# Patient Record
Sex: Male | Born: 1965 | Race: White | Hispanic: No | Marital: Single | Smoking: Never smoker
Health system: Southern US, Community
[De-identification: ages and names within clinical notes are randomized; demographics above are authoritative.]

## PROBLEM LIST (undated history)

## (undated) DIAGNOSIS — E119 Type 2 diabetes mellitus without complications: Secondary | ICD-10-CM

## (undated) DIAGNOSIS — K5090 Crohn's disease, unspecified, without complications: Secondary | ICD-10-CM

## (undated) DIAGNOSIS — E785 Hyperlipidemia, unspecified: Secondary | ICD-10-CM

## (undated) DIAGNOSIS — K529 Noninfective gastroenteritis and colitis, unspecified: Secondary | ICD-10-CM

## (undated) DIAGNOSIS — I10 Essential (primary) hypertension: Secondary | ICD-10-CM

## (undated) DIAGNOSIS — K5190 Ulcerative colitis, unspecified, without complications: Secondary | ICD-10-CM

## (undated) DIAGNOSIS — K635 Polyp of colon: Secondary | ICD-10-CM

## (undated) DIAGNOSIS — M150 Primary generalized (osteo)arthritis: Secondary | ICD-10-CM

## (undated) DIAGNOSIS — Z9289 Personal history of other medical treatment: Secondary | ICD-10-CM

## (undated) HISTORY — DX: Type 2 diabetes mellitus without complications: E11.9

## (undated) HISTORY — DX: Crohn's disease, unspecified, without complications: K50.90

## (undated) HISTORY — DX: Personal history of other medical treatment: Z92.89

## (undated) HISTORY — DX: Polyp of colon: K63.5

## (undated) HISTORY — DX: Ulcerative colitis, unspecified, without complications: K51.90

## (undated) HISTORY — DX: Hyperlipidemia, unspecified: E78.5

## (undated) HISTORY — DX: Primary generalized (osteo)arthritis: M15.0

## (undated) HISTORY — DX: Noninfective gastroenteritis and colitis, unspecified: K52.9

## (undated) HISTORY — DX: Essential (primary) hypertension: I10

## (undated) HISTORY — PX: EYE SURGERY: SHX253

## (undated) HISTORY — PX: OTHER SURGICAL HISTORY: SHX169

---

## 1993-04-29 ENCOUNTER — Encounter: Payer: Self-pay | Admitting: Internal Medicine

## 1993-05-02 ENCOUNTER — Encounter: Payer: Self-pay | Admitting: Internal Medicine

## 1993-05-20 ENCOUNTER — Encounter: Payer: Self-pay | Admitting: Internal Medicine

## 1995-09-07 ENCOUNTER — Encounter: Payer: Self-pay | Admitting: Internal Medicine

## 1997-01-21 ENCOUNTER — Encounter: Payer: Self-pay | Admitting: Internal Medicine

## 1998-02-05 ENCOUNTER — Encounter: Payer: Self-pay | Admitting: Internal Medicine

## 1999-02-21 ENCOUNTER — Encounter: Payer: Self-pay | Admitting: Internal Medicine

## 1999-02-21 ENCOUNTER — Other Ambulatory Visit: Admission: RE | Admit: 1999-02-21 | Discharge: 1999-02-21 | Payer: Self-pay | Admitting: Internal Medicine

## 1999-12-26 HISTORY — PX: COLON RESECTION: SHX5231

## 2000-03-28 ENCOUNTER — Other Ambulatory Visit: Admission: RE | Admit: 2000-03-28 | Discharge: 2000-03-28 | Payer: Self-pay | Admitting: Internal Medicine

## 2000-03-28 ENCOUNTER — Encounter: Payer: Self-pay | Admitting: Internal Medicine

## 2000-03-28 ENCOUNTER — Encounter (INDEPENDENT_AMBULATORY_CARE_PROVIDER_SITE_OTHER): Payer: Self-pay | Admitting: Specialist

## 2000-03-31 ENCOUNTER — Encounter: Payer: Self-pay | Admitting: Internal Medicine

## 2000-03-31 ENCOUNTER — Encounter (INDEPENDENT_AMBULATORY_CARE_PROVIDER_SITE_OTHER): Payer: Self-pay | Admitting: *Deleted

## 2000-03-31 ENCOUNTER — Ambulatory Visit (HOSPITAL_COMMUNITY): Admission: RE | Admit: 2000-03-31 | Discharge: 2000-03-31 | Payer: Self-pay | Admitting: Internal Medicine

## 2000-12-25 HISTORY — PX: VENTRAL HERNIA REPAIR: SHX424

## 2001-08-21 ENCOUNTER — Encounter (INDEPENDENT_AMBULATORY_CARE_PROVIDER_SITE_OTHER): Payer: Self-pay | Admitting: *Deleted

## 2001-08-21 ENCOUNTER — Encounter: Payer: Self-pay | Admitting: Internal Medicine

## 2001-08-21 ENCOUNTER — Ambulatory Visit (HOSPITAL_COMMUNITY): Admission: RE | Admit: 2001-08-21 | Discharge: 2001-08-21 | Payer: Self-pay | Admitting: Internal Medicine

## 2001-11-01 ENCOUNTER — Encounter: Admission: RE | Admit: 2001-11-01 | Discharge: 2001-11-01 | Payer: Self-pay | Admitting: Internal Medicine

## 2001-11-01 ENCOUNTER — Encounter (INDEPENDENT_AMBULATORY_CARE_PROVIDER_SITE_OTHER): Payer: Self-pay | Admitting: *Deleted

## 2001-11-01 ENCOUNTER — Encounter: Payer: Self-pay | Admitting: Internal Medicine

## 2001-12-13 ENCOUNTER — Encounter: Admission: RE | Admit: 2001-12-13 | Discharge: 2001-12-13 | Payer: Self-pay | Admitting: Infectious Diseases

## 2002-02-24 ENCOUNTER — Encounter: Admission: RE | Admit: 2002-02-24 | Discharge: 2002-02-24 | Payer: Self-pay | Admitting: Infectious Diseases

## 2003-09-17 ENCOUNTER — Encounter: Payer: Self-pay | Admitting: Internal Medicine

## 2005-12-14 ENCOUNTER — Ambulatory Visit: Payer: Self-pay | Admitting: Internal Medicine

## 2006-01-04 ENCOUNTER — Ambulatory Visit: Payer: Self-pay | Admitting: Internal Medicine

## 2006-02-07 ENCOUNTER — Ambulatory Visit: Payer: Self-pay | Admitting: Internal Medicine

## 2006-05-25 ENCOUNTER — Ambulatory Visit: Payer: Self-pay | Admitting: Internal Medicine

## 2006-12-06 ENCOUNTER — Ambulatory Visit: Payer: Self-pay | Admitting: Internal Medicine

## 2008-04-17 ENCOUNTER — Ambulatory Visit: Payer: Self-pay | Admitting: Internal Medicine

## 2008-05-01 ENCOUNTER — Encounter: Payer: Self-pay | Admitting: Internal Medicine

## 2008-05-01 ENCOUNTER — Ambulatory Visit: Payer: Self-pay | Admitting: Internal Medicine

## 2008-05-01 LAB — CONVERTED CEMR LAB
ALT: 25 units/L (ref 0–53)
AST: 34 units/L (ref 0–37)
Albumin: 3.8 g/dL (ref 3.5–5.2)
Alkaline Phosphatase: 90 units/L (ref 39–117)
BUN: 15 mg/dL (ref 6–23)
Basophils Absolute: 0 10*3/uL (ref 0.0–0.1)
Basophils Relative: 0.4 % (ref 0.0–1.0)
Bilirubin, Direct: 0.2 mg/dL (ref 0.0–0.3)
CO2: 28 meq/L (ref 19–32)
CRP, High Sensitivity: 6 — ABNORMAL HIGH (ref 0.00–5.00)
Calcium: 9.1 mg/dL (ref 8.4–10.5)
Chloride: 107 meq/L (ref 96–112)
Creatinine, Ser: 1.3 mg/dL (ref 0.4–1.5)
Eosinophils Absolute: 0.1 10*3/uL (ref 0.0–0.7)
Eosinophils Relative: 1.9 % (ref 0.0–5.0)
GFR calc Af Amer: 78 mL/min
GFR calc non Af Amer: 64 mL/min
Glucose, Bld: 131 mg/dL — ABNORMAL HIGH (ref 70–99)
HCT: 45.9 % (ref 39.0–52.0)
Hemoglobin: 14.9 g/dL (ref 13.0–17.0)
Lymphocytes Relative: 29.1 % (ref 12.0–46.0)
MCHC: 32.5 g/dL (ref 30.0–36.0)
MCV: 82.8 fL (ref 78.0–100.0)
Monocytes Absolute: 0.7 10*3/uL (ref 0.1–1.0)
Monocytes Relative: 12.4 % — ABNORMAL HIGH (ref 3.0–12.0)
Neutro Abs: 3.4 10*3/uL (ref 1.4–7.7)
Neutrophils Relative %: 56.2 % (ref 43.0–77.0)
Platelets: 203 10*3/uL (ref 150–400)
Potassium: 3.8 meq/L (ref 3.5–5.1)
RBC: 5.54 M/uL (ref 4.22–5.81)
RDW: 13.3 % (ref 11.5–14.6)
Sodium: 140 meq/L (ref 135–145)
Total Bilirubin: 2.1 mg/dL — ABNORMAL HIGH (ref 0.3–1.2)
Total Protein: 8.1 g/dL (ref 6.0–8.3)
WBC: 5.9 10*3/uL (ref 4.5–10.5)

## 2008-05-12 DIAGNOSIS — K5289 Other specified noninfective gastroenteritis and colitis: Secondary | ICD-10-CM

## 2008-05-12 DIAGNOSIS — K5190 Ulcerative colitis, unspecified, without complications: Secondary | ICD-10-CM | POA: Insufficient documentation

## 2008-05-12 DIAGNOSIS — E739 Lactose intolerance, unspecified: Secondary | ICD-10-CM

## 2008-05-14 ENCOUNTER — Ambulatory Visit: Payer: Self-pay | Admitting: Internal Medicine

## 2008-05-14 DIAGNOSIS — K5150 Left sided colitis without complications: Secondary | ICD-10-CM | POA: Insufficient documentation

## 2008-06-19 ENCOUNTER — Ambulatory Visit: Payer: Self-pay | Admitting: Internal Medicine

## 2008-06-19 DIAGNOSIS — R1013 Epigastric pain: Secondary | ICD-10-CM | POA: Insufficient documentation

## 2008-06-19 DIAGNOSIS — K219 Gastro-esophageal reflux disease without esophagitis: Secondary | ICD-10-CM

## 2008-07-23 ENCOUNTER — Ambulatory Visit: Payer: Self-pay | Admitting: Internal Medicine

## 2008-08-03 ENCOUNTER — Telehealth: Payer: Self-pay | Admitting: Internal Medicine

## 2008-08-18 ENCOUNTER — Ambulatory Visit: Payer: Self-pay | Admitting: Internal Medicine

## 2008-09-04 ENCOUNTER — Encounter (HOSPITAL_COMMUNITY): Admission: RE | Admit: 2008-09-04 | Discharge: 2008-12-03 | Payer: Self-pay | Admitting: Internal Medicine

## 2008-09-04 ENCOUNTER — Encounter: Payer: Self-pay | Admitting: Internal Medicine

## 2008-09-04 ENCOUNTER — Telehealth: Payer: Self-pay | Admitting: Internal Medicine

## 2008-09-10 ENCOUNTER — Ambulatory Visit: Payer: Self-pay | Admitting: Internal Medicine

## 2008-09-18 ENCOUNTER — Encounter: Payer: Self-pay | Admitting: Internal Medicine

## 2008-09-21 ENCOUNTER — Encounter: Payer: Self-pay | Admitting: Internal Medicine

## 2008-10-16 ENCOUNTER — Encounter: Payer: Self-pay | Admitting: Internal Medicine

## 2008-11-12 ENCOUNTER — Ambulatory Visit: Payer: Self-pay | Admitting: Internal Medicine

## 2008-12-08 ENCOUNTER — Encounter: Payer: Self-pay | Admitting: Internal Medicine

## 2008-12-10 ENCOUNTER — Encounter (HOSPITAL_COMMUNITY): Admission: RE | Admit: 2008-12-10 | Discharge: 2009-03-10 | Payer: Self-pay | Admitting: Internal Medicine

## 2008-12-11 ENCOUNTER — Encounter: Payer: Self-pay | Admitting: Internal Medicine

## 2008-12-22 ENCOUNTER — Encounter: Payer: Self-pay | Admitting: Internal Medicine

## 2008-12-28 ENCOUNTER — Telehealth: Payer: Self-pay | Admitting: Internal Medicine

## 2009-01-15 ENCOUNTER — Ambulatory Visit: Payer: Self-pay | Admitting: Internal Medicine

## 2009-02-04 ENCOUNTER — Encounter: Payer: Self-pay | Admitting: Internal Medicine

## 2009-02-05 ENCOUNTER — Encounter: Payer: Self-pay | Admitting: Internal Medicine

## 2009-03-02 ENCOUNTER — Encounter (INDEPENDENT_AMBULATORY_CARE_PROVIDER_SITE_OTHER): Payer: Self-pay | Admitting: *Deleted

## 2009-04-01 ENCOUNTER — Encounter (HOSPITAL_COMMUNITY): Admission: RE | Admit: 2009-04-01 | Discharge: 2009-06-30 | Payer: Self-pay | Admitting: Internal Medicine

## 2009-04-02 ENCOUNTER — Encounter: Payer: Self-pay | Admitting: Internal Medicine

## 2009-04-08 ENCOUNTER — Ambulatory Visit: Payer: Self-pay | Admitting: Internal Medicine

## 2009-04-29 ENCOUNTER — Encounter: Payer: Self-pay | Admitting: Internal Medicine

## 2009-05-12 ENCOUNTER — Telehealth: Payer: Self-pay | Admitting: Internal Medicine

## 2009-05-14 ENCOUNTER — Encounter: Payer: Self-pay | Admitting: Internal Medicine

## 2009-05-31 ENCOUNTER — Telehealth: Payer: Self-pay | Admitting: Internal Medicine

## 2009-06-18 ENCOUNTER — Telehealth: Payer: Self-pay | Admitting: Internal Medicine

## 2009-06-25 ENCOUNTER — Encounter: Payer: Self-pay | Admitting: Internal Medicine

## 2009-07-02 ENCOUNTER — Telehealth (INDEPENDENT_AMBULATORY_CARE_PROVIDER_SITE_OTHER): Payer: Self-pay | Admitting: *Deleted

## 2009-07-08 ENCOUNTER — Telehealth (INDEPENDENT_AMBULATORY_CARE_PROVIDER_SITE_OTHER): Payer: Self-pay | Admitting: *Deleted

## 2009-07-09 ENCOUNTER — Telehealth: Payer: Self-pay | Admitting: Internal Medicine

## 2009-07-28 ENCOUNTER — Encounter: Payer: Self-pay | Admitting: Internal Medicine

## 2009-07-30 ENCOUNTER — Ambulatory Visit: Payer: Self-pay | Admitting: Internal Medicine

## 2009-08-02 ENCOUNTER — Ambulatory Visit: Payer: Self-pay | Admitting: Internal Medicine

## 2009-08-05 ENCOUNTER — Encounter (HOSPITAL_COMMUNITY): Admission: RE | Admit: 2009-08-05 | Discharge: 2009-11-03 | Payer: Self-pay | Admitting: Internal Medicine

## 2009-08-05 ENCOUNTER — Encounter: Payer: Self-pay | Admitting: Internal Medicine

## 2009-08-06 ENCOUNTER — Encounter: Payer: Self-pay | Admitting: Internal Medicine

## 2009-08-09 ENCOUNTER — Telehealth: Payer: Self-pay | Admitting: Internal Medicine

## 2009-09-08 ENCOUNTER — Encounter: Payer: Self-pay | Admitting: Internal Medicine

## 2009-09-17 ENCOUNTER — Encounter: Payer: Self-pay | Admitting: Internal Medicine

## 2009-10-05 ENCOUNTER — Telehealth: Payer: Self-pay | Admitting: Internal Medicine

## 2009-10-08 ENCOUNTER — Encounter: Payer: Self-pay | Admitting: Internal Medicine

## 2009-10-29 ENCOUNTER — Encounter: Payer: Self-pay | Admitting: Internal Medicine

## 2009-12-10 ENCOUNTER — Encounter (HOSPITAL_COMMUNITY): Admission: RE | Admit: 2009-12-10 | Discharge: 2009-12-24 | Payer: Self-pay | Admitting: Internal Medicine

## 2009-12-10 ENCOUNTER — Encounter: Payer: Self-pay | Admitting: Internal Medicine

## 2010-01-20 ENCOUNTER — Encounter (HOSPITAL_COMMUNITY): Admission: RE | Admit: 2010-01-20 | Discharge: 2010-04-20 | Payer: Self-pay | Admitting: Internal Medicine

## 2010-01-21 ENCOUNTER — Encounter: Payer: Self-pay | Admitting: Internal Medicine

## 2010-01-28 ENCOUNTER — Ambulatory Visit: Payer: Self-pay | Admitting: Internal Medicine

## 2010-01-28 LAB — CONVERTED CEMR LAB
ALT: 21 units/L (ref 0–53)
AST: 21 units/L (ref 0–37)
Albumin: 4.1 g/dL (ref 3.5–5.2)
Alkaline Phosphatase: 77 units/L (ref 39–117)
BUN: 11 mg/dL (ref 6–23)
Basophils Absolute: 0 10*3/uL (ref 0.0–0.1)
Basophils Relative: 0.2 % (ref 0.0–3.0)
Bilirubin, Direct: 0.1 mg/dL (ref 0.0–0.3)
CO2: 26 meq/L (ref 19–32)
CRP, High Sensitivity: 3 (ref 0.00–5.00)
Calcium: 9.6 mg/dL (ref 8.4–10.5)
Chloride: 107 meq/L (ref 96–112)
Creatinine, Ser: 0.8 mg/dL (ref 0.4–1.5)
Eosinophils Absolute: 0.1 10*3/uL (ref 0.0–0.7)
Eosinophils Relative: 1.7 % (ref 0.0–5.0)
GFR calc non Af Amer: 111.49 mL/min (ref >60)
Glucose, Bld: 102 mg/dL — ABNORMAL HIGH (ref 70–99)
HCT: 43.5 % (ref 39.0–52.0)
Hemoglobin: 14.7 g/dL (ref 13.0–17.0)
Lymphocytes Relative: 29.3 % (ref 12.0–46.0)
Lymphs Abs: 1.8 10*3/uL (ref 0.7–4.0)
MCHC: 33.8 g/dL (ref 30.0–36.0)
MCV: 89.9 fL (ref 78.0–100.0)
Monocytes Absolute: 1 10*3/uL (ref 0.1–1.0)
Monocytes Relative: 15.5 % — ABNORMAL HIGH (ref 3.0–12.0)
Neutro Abs: 3.4 10*3/uL (ref 1.4–7.7)
Neutrophils Relative %: 53.3 % (ref 43.0–77.0)
Platelets: 228 10*3/uL (ref 150.0–400.0)
Potassium: 4 meq/L (ref 3.5–5.1)
RBC: 4.84 M/uL (ref 4.22–5.81)
RDW: 11.5 % (ref 11.5–14.6)
Sodium: 140 meq/L (ref 135–145)
TSH: 2.19 microintl units/mL (ref 0.35–5.50)
Total Bilirubin: 0.8 mg/dL (ref 0.3–1.2)
Total Protein: 7.8 g/dL (ref 6.0–8.3)
WBC: 6.3 10*3/uL (ref 4.5–10.5)

## 2010-01-31 ENCOUNTER — Telehealth: Payer: Self-pay | Admitting: Internal Medicine

## 2010-03-03 ENCOUNTER — Ambulatory Visit: Payer: Self-pay | Admitting: Internal Medicine

## 2010-03-04 ENCOUNTER — Telehealth: Payer: Self-pay | Admitting: Internal Medicine

## 2010-03-10 ENCOUNTER — Encounter: Payer: Self-pay | Admitting: Internal Medicine

## 2010-03-16 ENCOUNTER — Encounter: Payer: Self-pay | Admitting: Internal Medicine

## 2010-03-18 ENCOUNTER — Encounter: Payer: Self-pay | Admitting: Internal Medicine

## 2010-04-18 ENCOUNTER — Telehealth (INDEPENDENT_AMBULATORY_CARE_PROVIDER_SITE_OTHER): Payer: Self-pay | Admitting: *Deleted

## 2010-04-26 ENCOUNTER — Telehealth: Payer: Self-pay | Admitting: Internal Medicine

## 2010-04-28 ENCOUNTER — Encounter (HOSPITAL_COMMUNITY)
Admission: RE | Admit: 2010-04-28 | Discharge: 2010-07-27 | Payer: Self-pay | Source: Home / Self Care | Admitting: Internal Medicine

## 2010-04-29 ENCOUNTER — Encounter: Payer: Self-pay | Admitting: Internal Medicine

## 2010-06-10 ENCOUNTER — Encounter: Payer: Self-pay | Admitting: Internal Medicine

## 2010-06-22 ENCOUNTER — Ambulatory Visit: Payer: Self-pay | Admitting: Internal Medicine

## 2010-06-29 ENCOUNTER — Encounter: Payer: Self-pay | Admitting: Internal Medicine

## 2010-07-11 ENCOUNTER — Telehealth: Payer: Self-pay | Admitting: Internal Medicine

## 2010-07-27 ENCOUNTER — Encounter: Payer: Self-pay | Admitting: Internal Medicine

## 2010-07-28 ENCOUNTER — Encounter (HOSPITAL_COMMUNITY)
Admission: RE | Admit: 2010-07-28 | Discharge: 2010-10-26 | Payer: Self-pay | Source: Home / Self Care | Admitting: Internal Medicine

## 2010-08-02 ENCOUNTER — Ambulatory Visit: Payer: Self-pay | Admitting: Internal Medicine

## 2010-08-08 ENCOUNTER — Telehealth: Payer: Self-pay | Admitting: Internal Medicine

## 2010-08-09 ENCOUNTER — Encounter: Payer: Self-pay | Admitting: Internal Medicine

## 2010-09-01 ENCOUNTER — Encounter: Payer: Self-pay | Admitting: Internal Medicine

## 2010-09-09 ENCOUNTER — Encounter: Payer: Self-pay | Admitting: Internal Medicine

## 2010-09-26 ENCOUNTER — Encounter: Payer: Self-pay | Admitting: Internal Medicine

## 2010-10-03 ENCOUNTER — Encounter: Payer: Self-pay | Admitting: Internal Medicine

## 2010-10-17 ENCOUNTER — Encounter: Payer: Self-pay | Admitting: Internal Medicine

## 2010-10-21 ENCOUNTER — Encounter: Payer: Self-pay | Admitting: Internal Medicine

## 2010-10-24 ENCOUNTER — Telehealth: Payer: Self-pay | Admitting: Internal Medicine

## 2010-10-25 ENCOUNTER — Encounter: Payer: Self-pay | Admitting: Internal Medicine

## 2010-11-09 ENCOUNTER — Encounter: Payer: Self-pay | Admitting: Internal Medicine

## 2010-11-29 ENCOUNTER — Encounter: Payer: Self-pay | Admitting: Internal Medicine

## 2010-12-01 ENCOUNTER — Encounter (HOSPITAL_COMMUNITY)
Admission: RE | Admit: 2010-12-01 | Discharge: 2011-01-24 | Payer: Self-pay | Source: Home / Self Care | Attending: Internal Medicine | Admitting: Internal Medicine

## 2010-12-02 ENCOUNTER — Encounter: Payer: Self-pay | Admitting: Internal Medicine

## 2010-12-22 ENCOUNTER — Ambulatory Visit: Payer: Self-pay | Admitting: Internal Medicine

## 2010-12-23 LAB — CONVERTED CEMR LAB
ALT: 20 units/L (ref 0–53)
AST: 28 units/L (ref 0–37)
Albumin: 3.7 g/dL (ref 3.5–5.2)
Alkaline Phosphatase: 63 units/L (ref 39–117)
Basophils Absolute: 0 10*3/uL (ref 0.0–0.1)
CO2: 25 meq/L (ref 19–32)
Glucose, Bld: 157 mg/dL — ABNORMAL HIGH (ref 70–99)
Lymphocytes Relative: 32.5 % (ref 12.0–46.0)
Monocytes Relative: 13.5 % — ABNORMAL HIGH (ref 3.0–12.0)
Platelets: 186 10*3/uL (ref 150.0–400.0)
Potassium: 3.7 meq/L (ref 3.5–5.1)
RDW: 12.7 % (ref 11.5–14.6)
Sodium: 136 meq/L (ref 135–145)
TSH: 1.46 microintl units/mL (ref 0.35–5.50)
Total Protein: 7.5 g/dL (ref 6.0–8.3)

## 2010-12-27 ENCOUNTER — Encounter: Payer: Self-pay | Admitting: Internal Medicine

## 2010-12-28 ENCOUNTER — Telehealth: Payer: Self-pay | Admitting: Internal Medicine

## 2010-12-29 ENCOUNTER — Encounter: Payer: Self-pay | Admitting: Internal Medicine

## 2011-01-13 ENCOUNTER — Telehealth: Payer: Self-pay | Admitting: Internal Medicine

## 2011-01-23 ENCOUNTER — Telehealth (INDEPENDENT_AMBULATORY_CARE_PROVIDER_SITE_OTHER): Payer: Self-pay

## 2011-01-24 NOTE — Medication Information (Signed)
Summary: Patient Assistance/[employer]  Patient Assistance/[employer]   Imported By: Bubba Hales 09/29/2010 08:39:26  _____________________________________________________________________  External Attachment:    Type:   Image     Comment:   External Document

## 2011-01-24 NOTE — Op Note (Signed)
Summary: Remicade/[facility]  Remicade/[facility]   Imported By: Phillis Knack 05/11/2010 11:46:18  _____________________________________________________________________  External Attachment:    Type:   Image     Comment:   External Document

## 2011-01-24 NOTE — Medication Information (Signed)
Summary: Patient Assistance Program / [facility]  Patient Assistance Program / [facility]   Imported By: Rise Patience 11/11/2010 11:44:07  _____________________________________________________________________  External Attachment:    Type:   Image     Comment:   External Document

## 2011-01-24 NOTE — Op Note (Signed)
Summary: Remicade/[facility]   Imported By: Phillis Knack 01/27/2010 07:54:46  _____________________________________________________________________  External Attachment:    Type:   Image     Comment:   External Document

## 2011-01-24 NOTE — Progress Notes (Signed)
Summary: ? re orders  Phone Note Call from Patient Call back at Home Phone [phone] Call back at [phone]   Caller: mom Matthew [name] Call For: [name] Reason for Call: Talk to Nurse Summary of Call: Mother wants to speak to nurse regarding orders for chemo that she just reviewd on bill she received. Initial call taken by: Ronalee Red,  August 08, 2010 8:34 AM  Follow-up for Phone Call        Mother rec'd. bill for pt's Remicade marked as chemotherapy.Arbie Cookey in billing at [phone] contacted and bill should have been written off at 100% which she did due to his finances.. Pt's mother made aware. Follow-up by: Abel Presto RN,  August 08, 2010 11:07 AM

## 2011-01-24 NOTE — Letter (Signed)
Summary: Patient Notice- Colon Biospy Results  [facility]  [address] [place], [place] [address]   Phone: [phone]  Fax: [phone]        March 10, 2010 MRN: [id number]    [place], [place]  [address]    Dear Matthew [name],  I am pleased to inform you that the biopsies taken during your recent colonoscopy did NOT show any evidence of cancer or precancerous polyps upon pathologic examination. There was some atypia (irregularity) to some of the cells on biopsy, but this is likely the result of inflammation.  Additional information/recommendations:  I want you to:  (1) continue the current medical regimine without change;   (2) schedule a follow up office appointment for 3 months; and   (3) plan on repeat colonoscopy with biopsies in ONE year.   Please call us if you are having persistent problems or have questions about your condition that have not been fully answered at this time.  Sincerely,  Irene Shipper MD   This letter has been electronically signed by your physician.  Appended Document: Patient Notice- Colon Biospy Results letter mailed 3.18.11

## 2011-01-24 NOTE — Progress Notes (Signed)
  Phone Note Other Incoming   Request: Send information Summary of Call: Request received from [place] forwarded to Elizabeth.

## 2011-01-24 NOTE — Medication Information (Signed)
Summary: Patient Assistance/[employer]  Patient Assistance/[employer]   Imported By: Bubba Hales 08/15/2010 07:49:12  _____________________________________________________________________  External Attachment:    Type:   Image     Comment:   External Document

## 2011-01-24 NOTE — Medication Information (Signed)
[facility]: [facility]   Imported By: Bubba Hales 10/28/2010 07:26:45  _____________________________________________________________________  External Attachment:    Type:   Image     Comment:   External Document

## 2011-01-24 NOTE — Miscellaneous (Signed)
Summary: Remicade order  Clinical Lists Changes  Orders: Added new Test order of Remicade Infusion (Remicade) - Signed

## 2011-01-24 NOTE — Assessment & Plan Note (Signed)
Summary: TB FOR REMICADE/YF  Nurse Visit   Immunizations Administered:  PPD Skin Test:    Vaccine Type: PPD    Site: left forearm    Mfr: Sanofi Pasteur    Dose: 0.1 ml    Route: ID    Given by: Abelino Derrick CMA ([place])    Exp. Date: 10/07/2011    Lot #: [id number]  PPD Results    Date of reading: 08/04/2010    Results: < 66m    Interpretation: negative PChristian MateCMA ([place]  August 04, 2010 10:06 AM

## 2011-01-24 NOTE — Procedures (Signed)
Summary: Colonoscopy  Patient: Matthew [name] Note: All result statuses are Final unless otherwise noted.  Tests: (1) Colonoscopy (COL)   COL Colonoscopy           [facility]     [place], [place]  [address]           COLONOSCOPY PROCEDURE REPORT           PATIENT:  Matthew [name], Matthew [name]  MR#:  [id number]     BIRTHDATE:  09-08-1966, 43 yrs. old  GENDER:  male           ENDOSCOPIST:  Docia Chuck. Geri Seminole, MD     Referred by:  Office           PROCEDURE DATE:  03/03/2010     PROCEDURE:  Colonoscopy with biopsy,     Colonoscopy with snare polypectomy     x 3           ASA CLASS:  Class II     INDICATIONS:  evaluation of ulcerative colitis - now on Remicade     w/ some symptoms. Last colonoscopy 04-2008           MEDICATIONS:   Fentanyl 100 mcg IV, Versed 12 mg IV           DESCRIPTION OF PROCEDURE:   After the risks benefits and     alternatives of the procedure were thoroughly explained, informed     consent was obtained.  Digital rectal exam was performed and     revealed no abnormalities.   The LB CF-[id number] endoscope     was introduced through the anus and advanced to the anastamosis,     without limitations.  The quality of the prep was excellent, using     MoviPrep.  The instrument was then slowly withdrawn as the colon     was fully examined.     <<PROCEDUREIMAGES>>           FINDINGS:  There were mucosal changes consistent with mild     left-sided ulcerative colitis. Approximately 45cm of colon     remained. 4 quad bx q 10cm (N=18) taken. The anastomosis was     widely patent and neoileum was normal.  Three irregular polyps     were found in the rectum. Polyps were snared, then cauterized with     monopolar cautery. Retrieval was successful.  Also, somewhat     villous appearring tissue in the most distal rectum biopsied     multiple times to r/o dysplasia.  Unable to retroflex.    The     scope was then withdrawn from the patient  and the procedure     completed.           COMPLICATIONS:  None           ENDOSCOPIC IMPRESSION:     1) Colitis - left UC - s/p bx     2) Three polyps in the rectum -removed     3) Irregular rectal tissue biopsied           RECOMMENDATIONS:     1) Await biopsy results     2) continue current medications           ______________________________     Docia Chuck. Geri Seminole, MD           CC:  Kathleene Hazel MD; The Patient  n.     eSIGNED:   Docia Chuck. Geri Seminole at 03/03/2010 10:13 AM           [place], [id number]  Note: An exclamation mark (!) indicates a result that was not dispersed into the flowsheet. Document Creation Date: 03/03/2010 10:13 AM _______________________________________________________________________  (1) Order result status: Final Collection or observation date-time: 03/03/2010 09:51 Requested date-time:  Receipt date-time:  Reported date-time:  Referring Physician:   Ordering Physician: Lavena Bullion [phone]) Specimen Source:  Source: Tawanna Cooler Order Number: [id number] Lab site:   Appended Document: Colonoscopy     Procedures Next Due Date:    Colonoscopy: 02/2011

## 2011-01-24 NOTE — Progress Notes (Signed)
Summary: Shipment of Pentasa  Phone Note Call from Patient   Caller: Patient Summary of Call: Wanted to know if shipment of Pentasa was going to be shipped to his address or [place].  I advised him that I marked it to be sent to his address when completing the order form.  I advised him to call me if he receives it and I would call him if we receive them here.  He agreed. Initial call taken by: Randye Lobo NCMA,  October 24, 2010 1:10 PM

## 2011-01-24 NOTE — Medication Information (Signed)
Summary: [facility]   Imported By: Bubba Hales 09/06/2010 11:02:48  _____________________________________________________________________  External Attachment:    Type:   Image     Comment:   External Document

## 2011-01-24 NOTE — Op Note (Signed)
Summary: Remicade/[facility]  Remicade/[facility]   Imported By: Phillis Knack 09/13/2010 08:04:03  _____________________________________________________________________  External Attachment:    Type:   Image     Comment:   External Document

## 2011-01-24 NOTE — Medication Information (Signed)
Summary: [facility]  [facility]   Imported By: Phillis Knack 08/11/2010 12:21:11  _____________________________________________________________________  External Attachment:    Type:   Image     Comment:   External Document

## 2011-01-24 NOTE — Assessment & Plan Note (Signed)
Summary: ulcerative colitis / 3 months f.u..   History of Present Illness Visit Type: Follow-up Visit Primary GI MD: Scarlette Shorts MD Primary Provider: Kathleene Hazel MD Requesting Provider: n/a Chief Complaint: f/u ulcerative colitis. Pt states he feels good but still having stomach aches History of Present Illness:   45 year old with indeterminate colitis, although blue ulcerative colitis, status post subtotal colectomy with residual 40 cm the left colon and rectum remaining. He is intolerant immunomodulators. His colitis is being treated with a combination of Pentasa 2 g b.i.d. and Remicade 5 mg per kilogram every 6 weeks. He was last evaluated in March when he underwent surveillance colonoscopy. He was found to have active left-sided colitis, rectal polyps, and irregular rectal tissue. Biopsies revealed mild to moderate chronic active ulcerative colitis. Rectal biopsies revealed glandular atypia likely due to inflammation. Due to these changes, the recommendations were continued medical therapy and followup interval colonoscopy in one year. He presents today for routine followup. In terms of his colitis, he feels that he is doing well. He describes 8-10 bowel movements per day. Mostly small bowel movements, but formed. Rare blood. He is certain that he has done much better on Remicade. He does describe a one-year history of gastric distress that occurs in the morning after taking medications. He thinks that it is his oral doxycycline that he takes for his ocular condition. Problem last in the morning about 30 minutes after taking his pills. No problem when he forgets or purposely omits is pill. Otherwise doing well. No fevers, joint aches, skin eruptions or other issues.   GI Review of Systems    Reports abdominal pain.     Location of  Abdominal pain: epigastric upset.    Denies acid reflux, belching, bloating, chest pain, dysphagia with liquids, dysphagia with solids, heartburn, loss of appetite,  nausea, vomiting, vomiting blood, weight loss, and  weight gain.      Reports rectal bleeding.     Denies anal fissure, black tarry stools, change in bowel habit, constipation, diarrhea, diverticulosis, fecal incontinence, heme positive stool, hemorrhoids, irritable bowel syndrome, jaundice, light color stool, liver problems, and  rectal pain.    Current Medications (verified): 1)  Mesalamine 4 Gm Enem (Mesalamine) .... Once Daily 2)  Cosopt 2-0.5 % Soln (Dorzolamide-Timolol) .... 2 Drops in Left Eye Once Daily 3)  Travatan Z 0.004 %  Soln (Travoprost) .... One Drop in Left Eye Once Daily 4)  Pentasa 500 Mg  Cpcr (Mesalamine) .... Four Tablets By Mouth Two Times A Day 5)  Ambien 10 Mg  Tabs (Zolpidem Tartrate) .Marland Kitchen.. 1 By Mouth At Bedtime As Needed 6)  Lotemax 0.5 %  Susp (Loteprednol Etabonate) .... One Drop in Each Once Daily 7)  Tylenol Extra Strength 500 Mg Tabs (Acetaminophen) .... As Needed 8)  Multivitamins  Tabs (Multiple Vitamin) .... Take 1 Tablet By Mouth Once A Day 9)  Imodium Advanced 2-125 Mg Tabs (Loperamide-Simethicone) .Marland Kitchen.. 1 Tablet By Mouth Once Daily As Needed 10)  Glucosamine-Chondroitin   Caps (Glucosamine-Chondroit-Vit C-Mn) .... Once Daily 11)  Tobramycin-Dexamethasone 0.3-0.1 % Susp (Tobramycin-Dexamethasone) .... One Drop in Left Eye Four Times A Day 12)  Remicade 100 Mg Solr (Infliximab) .... 5 Mg/kg Iv Q 6 Weeks  Allergies (verified): 1)  ! * Ivp Dye 2)  ! * Dimetapp  Past History:  Past Medical History: Reviewed history from 05/14/2008 and no changes required. 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 Status post subtotal colectomy with ileal colonic anastomosis August 2001. 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 Family History of Colon Cancer: Maternal Grandfather Family History of Liver Cancer: Maternal Grandfather (mets from colon) Family History of Colitis/Crohn's: Father (colitis) Family History of Colon Polyps: Father, Mother Family History of Diabetes: Father, paternal grandmother Family History of Heart Disease: Father, Paternal Grandmother Family History of Breast Cancer:Cousin Family History of Prostate Cancer:Cousin  Social History: Reviewed history from 11/12/2008 and no changes required. The patient is single and resides at home. He is currently unemployed. Difficulty finding employment in maintaining employment due to his ongoing medical problems. He does not smoke. Has one sister. 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 Alcohol Use - no Daily Caffeine INO-6-VEHM per week Illicit Drug Use - no Patient does not get regular exercise.   Review of Systems       The patient complains of change in vision.  The patient denies allergy/sinus, anemia, anxiety-new, arthritis/joint pain, back pain, blood in urine, breast changes/lumps, confusion, cough, coughing up blood, depression-new, fainting, fatigue, fever, headaches-new, hearing problems, heart murmur, heart rhythm changes, itching, muscle pains/cramps, night sweats, nosebleeds, shortness of breath, skin rash, sleeping problems, sore throat, swelling of feet/legs, swollen lymph glands, thirst - excessive, urination - excessive, urination changes/pain, urine leakage, vision changes, and voice change.    Vital Signs:  Patient profile:   45 year old male Height:      71 inches Weight:      267 pounds BMI:     37.37 BSA:     2.39 Pulse rate:   96 / minute Pulse rhythm:   regular BP sitting:   142 / 80  (left arm) Cuff size:   regular  Vitals Entered By: Hope Pigeon  CMA (June 22, 2010 11:11 AM)  Physical Exam  General:  Well developed, well nourished, no acute distress. Head:  Normocephalic and atraumatic. Eyes:  no icterus Mouth:  no thrush Lungs:  Clear throughout to auscultation. Heart:  Regular rate and rhythm; no murmurs, rubs,  or bruits. Abdomen:  Soft, nontender and nondistended. No masses, hepatosplenomegaly or hernias noted. Normal bowel sounds.. Prior surgical incisions well-healed Pulses:  Normal pulses noted. Extremities:  no edema Neurologic:  alert and oriented Skin:  no jaundice Psych:  Alert and cooperative. Normal mood and affect.   Impression & Recommendations:  Problem # 1:  ULCERATIVE COLITIS-LEFT SIDE (ICD-556.5) clinically stable on combination of mesalamine and Remicade.  Plan: #1. Continue mesalamine #2. Continue Remicade #3. Routine office followup with laboratories in 6 months #4. Contact the office in the interim for questions or problems  Problem # 2:  ABDOMINAL PAIN-EPIGASTRIC (ICD-789.06) daily epigastric discomfort secondary to medication tolerance, likely doxycycline. Could consider talking to his ophthalmologist about other options, and possible.  Patient Instructions: 1)  Please schedule a follow-up appointment in 6 months. 2)  The medication list was reviewed and reconciled.  All changed / newly prescribed medications were explained.  A complete medication list was provided to the patient / caregiver. 3)  Copy: Dr. Kathleene Hazel

## 2011-01-24 NOTE — Miscellaneous (Signed)
Summary: Orders Update-Remicade  Clinical Lists Changes  Orders: Added new Test order of Remicade Infusion (Remicade) - Signed

## 2011-01-24 NOTE — Progress Notes (Signed)
Summary: triage  Phone Note Call from Patient Call back at Home Phone [phone]   Caller: mom [name] Call For: Matthew [name] Reason for Call: Talk to Nurse Summary of Call: Patient had procedure yesterday and vomit last night and had diarrhea all night , states that pt has nausea and is afraid to eat. Initial call taken by: Ronalee Red,  March 04, 2010 8:07 AM  Follow-up for Phone Call        Spoke with pts mother.  After procedure yesterday pt. went home and ate.  He then took his meds and then vomitted x 1.  He had taken doxyclycine which she says "sometimes makes him nauseated".  He then had several episodes of diarrhea.  There was no blood noticed in his stools.  Pt has not vomitted since yesterday but still feels a little nauseated.  Wants to know if he should eat.  Advised pt. to try some crackers or dry toast to see how he does with that.  They are to call back this afternoon if he is no better.  Pt was also scheduled for a Remicade injections today at short stay and his mother called and was told he should not come today.  He was rescheduled for an appt. in 2 weeks.   Follow-up by: Alphonsa Gin RN,  March 04, 2010 8:42 AM     Appended Document: triage agree.

## 2011-01-24 NOTE — Progress Notes (Signed)
Summary: Form  Phone Note Call from Patient Call back at Home Phone [phone]   Caller: Patient Call For: [name] Summary of Call: Patients mother has questions about a form she got from [place] pt assistance and she wants to know what to do with the form and which medication it has to do with.  Initial call taken by: Bernita Buffy CMA Deborra Medina),  July 11, 2010 9:59 AM  Follow-up for Phone Call        Pt. also got Dr's part of pt's Remicade application. I told her Dr.Perry's part has already been completed and faxed on 07/05/10 so she will send [name]'s financial info. Follow-up by: Abel Presto RN,  July 11, 2010 11:45 AM

## 2011-01-24 NOTE — Progress Notes (Signed)
Summary: ? re appt  Phone Note Call from Patient Call back at Home Phone [phone]   Caller: Patient Call For: [name] Reason for Call: Talk to Nurse Summary of Call: Patient wants to know if the day after his procedure(3-10) he can have a remicade infusion (3-11) Initial call taken by: Ronalee Red,  January 31, 2010 2:40 PM  Follow-up for Phone Call        yes Follow-up by: Irene Shipper MD,  January 31, 2010 3:09 PM  Additional Follow-up for Phone Call Additional follow up Details #1::        Pt. notified that it is ok. to have Remicade tx. the day after his colonoscopy per Dr.Perry.. Additional Follow-up by: Abel Presto RN,  January 31, 2010 3:34 PM

## 2011-01-24 NOTE — Medication Information (Signed)
Summary: Pentasa/[facility]  Pentasa/[facility]   Imported By: Phillis Knack 08/08/2010 08:40:30  _____________________________________________________________________  External Attachment:    Type:   Image     Comment:   External Document

## 2011-01-24 NOTE — Op Note (Signed)
Summary: Remicade/[facility]   Imported By: Phillis Knack 03/26/2010 11:32:39  _____________________________________________________________________  External Attachment:    Type:   Image     Comment:   External Document

## 2011-01-24 NOTE — Letter (Signed)
Summary: [facility] Instructions  [facility]  [name], [place] [address]   Phone: [phone]  Fax: [phone]       Matthew [name]    1966/05/11    MRN: [id number]        Procedure Day /Date:THURSDAY, 03/03/10     Arrival Time:7:30 AM     Procedure Time:8:30 AM     Location of Procedure:                    [facility] (4th Floor)                        [place]   Starting 5 days prior to your procedure 02/26/10 do not eat nuts, seeds, popcorn, corn, beans, peas,  salads, or any raw vegetables.  Do not take any fiber supplements (e.g. Metamucil, Citrucel, and Benefiber).  THE DAY BEFORE YOUR PROCEDURE         DATE: 03/02/10  DAY: WEDNESDAY  1.  Drink clear liquids the entire day-NO SOLID FOOD  2.  Do not drink anything colored red or purple.  Avoid juices with pulp.  No orange juice.  3.  Drink at least 64 oz. (8 glasses) of fluid/clear liquids during the day to prevent dehydration and help the prep work efficiently.  CLEAR LIQUIDS INCLUDE: Water Jello Ice Popsicles Tea (sugar ok, no milk/cream) Powdered fruit flavored drinks Coffee (sugar ok, no milk/cream) Gatorade Juice: apple, white grape, white cranberry  Lemonade Clear bullion, consomm, broth Carbonated beverages (any kind) Strained chicken noodle soup Hard Candy                             4.  In the morning, mix first dose of MoviPrep solution:    Empty 1 Pouch A and 1 Pouch B into the disposable container    Add lukewarm drinking water to the top line of the container. Mix to dissolve    Refrigerate (mixed solution should be used within 24 hrs)  5.  Begin drinking the prep at 5:00 p.m. The MoviPrep container is divided by 4 marks.   Every 15 minutes drink the solution down to the next mark (approximately 8 oz) until the full liter is complete.   6.  Follow completed prep with 16 oz of clear liquid of your choice (Nothing red  or purple).  Continue to drink clear liquids until bedtime.  7.  Before going to bed, mix second dose of MoviPrep solution:    Empty 1 Pouch A and 1 Pouch B into the disposable container    Add lukewarm drinking water to the top line of the container. Mix to dissolve    Refrigerate  THE DAY OF YOUR PROCEDURE      DATE: 03/03/10 [id number]  Beginning at 3:30 a.m. (5 hours before procedure):         1. Every 15 minutes, drink the solution down to the next mark (approx 8 oz) until the full liter is complete.  2. Follow completed prep with 16 oz. of clear liquid of your choice.    3. You may drink clear liquids until 6:30 AM (2 HOURS BEFORE PROCEDURE).   MEDICATION INSTRUCTIONS  Unless otherwise instructed, you should take regular prescription medications with a small sip of water   as early as possible the morning of your procedure.  OTHER INSTRUCTIONS  You will need a responsible adult at least 46 years of age to accompany you and drive you home.   This person must remain in the waiting room during your procedure.  Wear loose fitting clothing that is easily removed.  Leave jewelry and other valuables at home.  However, you may wish to bring a book to read or  an iPod/MP3 player to listen to music as you wait for your procedure to start.  Remove all body piercing jewelry and leave at home.  Total time from sign-in until discharge is approximately 2-3 hours.  You should go home directly after your procedure and rest.  You can resume normal activities the  day after your procedure.  The day of your procedure you should not:   Drive   Make legal decisions   Operate machinery   Drink alcohol   Return to work  You will receive specific instructions about eating, activities and medications before you leave.    The above instructions have been reviewed and explained to me by   _______________________    I fully understand and can verbalize these  instructions _____________________________ Date _________

## 2011-01-24 NOTE — Progress Notes (Signed)
Summary: refill  Phone Note Call from Patient Call back at Home Phone [phone]   Caller: mother Call For: Dr. [name] Reason for Call: Refill Medication Summary of Call: [facility] refill needed... mother says pt is on a "program" that sends pt free meds Initial call taken by: Lucien Mons,  Apr 26, 2010 9:48 AM  Follow-up for Phone Call        [facility] and pt. has 2 RFs. remaining.  They will ship to patient and will send our office and patient a renewal form when it is time.  Rx. is good to October 2011.  AttnLinwood Dibbles.  Randye Lobo NCMA  Apr 27, 2010 9:36 AM Pt.'s mom was called and informed.  Randye Lobo NCMA  Apr 27, 2010 9:36 AM      Appended Document: refill [facility] telephone number   [phone] for patient assistance.

## 2011-01-24 NOTE — Assessment & Plan Note (Signed)
Summary: 38M FU - Colitis   History of Present Illness Visit Type: Follow-up Visit Primary GI MD: Scarlette Shorts MD Primary Provider: Kathleene Hazel MD Requesting Provider: n/a Chief Complaint: doing well on Remicade, pt has occasional occurences of pain and bleeding History of Present Illness:   45 year old white male with indeterminate colitis, probably ulcerative colitis, for which she is status post subtotal colectomy with residual 40 cm the left colon and rectum remaining. He is intolerant to immunomodulators. He has been on Remicade therapy since September 2009. He is also on Pentasa, mesalamine enemas, and Imodium for diarrhea. His Remicade dosages 5 mg per kilogram every 6 weeks. He was last evaluated July 30, 2009. Since that time, he reports continuing to have about 8-9 bowel movements per day. 75% performed. Over the past month he's had significant bleeding with most bowel movements. He thinks this is lest rheumatic first week after Remicade. At this point, is not convinced how much benefit he is getting from his current medical regimen. He takes about 4-6 Imodium per day. His last colonoscopy, in May of 2009, revealed significant active colitis which was moderately severe. He has been having some increased normal discomfort over the past 4 weeks. No other symptoms or problems. He has been trying to obtain disability. Despite his chronic, refractory, and incapacitating bowel disease as well as left eye blindness and speech impediment, he has been unsuccessful.   GI Review of Systems    Reports abdominal pain.     Location of  Abdominal pain: lower abdomen.    Denies acid reflux, belching, bloating, chest pain, dysphagia with liquids, dysphagia with solids, heartburn, loss of appetite, nausea, vomiting, vomiting blood, weight loss, and  weight gain.      Reports diarrhea and  rectal bleeding.     Denies anal fissure, black tarry stools, change in bowel habit, constipation, diverticulosis,  fecal incontinence, heme positive stool, hemorrhoids, irritable bowel syndrome, jaundice, light color stool, liver problems, and  rectal pain.    Current Medications (verified): 1)  Mesalamine 4 Gm Enem (Mesalamine) .... Once Daily 2)  Cosopt 2-0.5 % Soln (Dorzolamide-Timolol) .... 2 Drops in Left Eye Once Daily 3)  Travatan Z 0.004 %  Soln (Travoprost) .... One Drop in Left Eye Once Daily 4)  Pentasa 500 Mg  Cpcr (Mesalamine) .... Four Tablets By Mouth Two Times A Day 5)  Ambien 10 Mg  Tabs (Zolpidem Tartrate) .Marland Kitchen.. 1 By Mouth At Bedtime As Needed 6)  Lotemax 0.5 %  Susp (Loteprednol Etabonate) .... One Drop in Each Once Daily 7)  Tylenol Extra Strength 500 Mg Tabs (Acetaminophen) .... As Needed 8)  Multivitamins  Tabs (Multiple Vitamin) .... Take 1 Tablet By Mouth Once A Day 9)  Imodium Advanced 2-125 Mg Tabs (Loperamide-Simethicone) .Marland Kitchen.. 1 Tablet By Mouth Once Daily As Needed 10)  Glucosamine-Chondroitin   Caps (Glucosamine-Chondroit-Vit C-Mn) .... Once Daily 11)  Tobramycin-Dexamethasone 0.3-0.1 % Susp (Tobramycin-Dexamethasone) .... One Drop in Left Eye Four Times A Day 12)  Remicade 100 Mg Solr (Infliximab) .... 5 Mg/kg Iv Q 6 Weeks  Allergies (verified): 1)  ! * Ivp Dye 2)  ! * Dimetapp  Past History:  Past Medical History: Reviewed history from 05/14/2008 and no changes required. Current Problems:  Visual impairment ULCERATIVE COLITIS (ICD-556.9)- Onset 1994 (Initial diagnosis In [place], [place]) INFLAMMATORY BOWEL DISEASE (ICD-558.9)  Past Surgical History: Reviewed history from 07/30/2009 and no changes required. Status post subtotal colectomy with ileal colonic anastomosis August 2001. Status post  ventral hernia repair left eye cornea transplant, repair after injury right eye cornea transplant laser surgery left eye surgery on left eyelid--07-28-2009  Family History: Reviewed history from 01/15/2009 and no changes required. Family History of Colon Cancer:  Maternal Grandfather Family History of Liver Cancer: Maternal Grandfather (mets from colon) Family History of Colitis/Crohn's: Father (colitis) Family History of Colon Polyps: Father, Mother Family History of Diabetes: Father, paternal grandmother Family History of Heart Disease: Father, Paternal Grandmother Family History of Breast Cancer:Cousin Family History of Prostate Cancer:Cousin  Social History: Reviewed history from 11/12/2008 and no changes required. The patient is single and resides at home. He is currently unemployed. Difficulty finding employment in maintaining employment due to his ongoing medical problems. He does not smoke. Has one sister. 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 Alcohol Use - no Daily Caffeine [id number] per week Illicit Drug Use - no Patient does not get regular exercise.   Review of Systems       fatigue, visual impairment, joint aches, skin rash. All other systems reviewed are reported as negative  Vital Signs:  Patient profile:   45 year old male Height:      71 inches Weight:      264 pounds Pulse rate:   100 / minute Pulse rhythm:   regular BP sitting:   140 / 90  (left arm) Cuff size:   regular  Vitals Entered By: Abelino Derrick CMA Deborra Medina) (January 28, 2010 10:52 AM)  Physical Exam  General:  Well developed, well nourished, no acute distress. Head:  Normocephalic and atraumatic. Eyes:  6 dilated left pupil was redness and swelling of the conjunctiva and dilated Nose:  No deformity, discharge,  or lesions. Mouth:  No deformity or lesions, dentition normal. Lungs:  Clear throughout to auscultation. Heart:  Regular rate and rhythm; no murmurs, rubs,  or bruits. Abdomen:  Soft, obese,nontender and nondistended. No masses, hepatosplenomegaly or hernias noted. Normal bowel sounds.. Multiple surgical incisions well-healed Rectal:  deferred until colonoscopy Msk:  Symmetrical with no gross deformities. Normal posture. Pulses:  Normal  pulses noted. Extremities:  no edema Neurologic:  Alert and  oriented x4;  Skin:  seborrhea Psych:  Alert and cooperative. Normal mood and affect.   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 Despite oral and  topical mesalamine as well as every 6 week Remicade, he seems to be having significant symptoms (bleeding, abdominal pain, frequent bowel movements) related to his disease. At this point, we need to reassess his colonic mucosa to objectively grade his colitis. As well, he is due for followup blood work.  Plan: #1. Continue mesalamine therapies and Remicade without change #2. Increase Imodium up to 8 per day #3. Colonoscopy. The nature of the procedure as well as the risks, benefits, and alternatives were again reviewed. He understood and agreed to proceed. #4. Movi prep prescribed. The patient instructed on its use. #5. CBC, CRP, and comprehensive metabolic panel today  Other Orders: TLB-CBC Platelet - w/Differential (85025-CBCD) TLB-BMP (Basic Metabolic Panel-BMET) (99357-[id number]) TLB-Hepatic/Liver Function Pnl (80076-HEPATIC) TLB-TSH (Thyroid Stimulating Hormone) (84443-TSH) TLB-CRP-High Sensitivity (C-Reactive Protein) (86140-FCRP) Colonoscopy (Colon)  Patient Instructions: 1)  Colon [facility] 03/03/10 8:30 am 2)  Movi prep instructions given to patient. 3)  Movi prep Rx. sent to your pharmacy for you to pick up. 4)  Colonoscopy  brochure given.  5)  Labs ordered for patient to go down to basement today and have drawn. 6)  The medication list was reviewed and reconciled.  All changed / newly prescribed medications were explained.  A complete medication list was provided to the patient / caregiver. 7)  Copy: Dr. Kathleene Hazel Prescriptions: AMBIEN 10 MG  TABS (ZOLPIDEM TARTRATE) 1 by mouth at bedtime as needed  #30 x 6   Entered by:   Randye Lobo NCMA   Authorized by:   Irene Shipper MD    Signed by:   Randye Lobo NCMA on 01/28/2010   Method used:   Printed then faxed to ...       [facility] (retail)       [place], [place]  [address]       Ph: [phone]       Fax: [phone]   RxID:   [id number] MESALAMINE 4 GM ENEM (MESALAMINE) once daily  #1680 [place] x 11   Entered by:   Randye Lobo NCMA   Authorized by:   Irene Shipper MD   Signed by:   Randye Lobo NCMA on 01/28/2010   Method used:   Electronically to        [facility] (retail)       [place], [place]  [address]       Ph: [phone]       Fax: [phone]   RxID:   [id number] MOVIPREP 100 GM  SOLR (PEG-KCL-NACL-NASULF-NA ASC-C) As per prep instructions.  #1 x 0   Entered by:   Randye Lobo NCMA   Authorized by:   Irene Shipper MD   Signed by:   Randye Lobo NCMA on 01/28/2010   Method used:   Electronically to        [facility] (retail)       [place], [place]  [address]       Ph: [phone]       Fax: [phone]   RxID:   [id number]

## 2011-01-24 NOTE — Medication Information (Signed)
Summary: Pentasa approved/[facility]   Pentasa approved/[facility]   Imported By: Phillis Knack 10/17/2010 07:36:20  _____________________________________________________________________  External Attachment:    Type:   Image     Comment:   External Document

## 2011-01-24 NOTE — Op Note (Signed)
Summary: Remicade/[place]  Remicade/[facility]   Imported By: Phillis Knack 06/15/2010 13:09:53  _____________________________________________________________________  External Attachment:    Type:   Image     Comment:   External Document

## 2011-01-24 NOTE — Op Note (Signed)
Summary: Remicade/[place]  Remicade/[facility]   Imported By: Phillis Knack 10/27/2010 07:54:01  _____________________________________________________________________  External Attachment:    Type:   Image     Comment:   External Document

## 2011-01-25 ENCOUNTER — Telehealth (INDEPENDENT_AMBULATORY_CARE_PROVIDER_SITE_OTHER): Payer: Self-pay | Admitting: *Deleted

## 2011-01-26 NOTE — Letter (Signed)
Summary: [facility] Instructions  [facility]  [place], [place] [address]   Phone: [phone]  Fax: [phone]       CODEN FRANCHI    1966/09/14    MRN: [id number]        Procedure Day /Date: Friday February 10th, 2012     Arrival Time:9:00am     Procedure Time: 10:00am     Location of Procedure:                    _ x_  [place] (4th Floor)                        [place]   Starting 5 days prior to your procedure 01/29/11 do not eat nuts, seeds, popcorn, corn, beans, peas,  salads, or any raw vegetables.  Do not take any fiber supplements (e.g. Metamucil, Citrucel, and Benefiber).  THE DAY BEFORE YOUR PROCEDURE         DATE: 02/02/11  DAY: Thursday  1.  Drink clear liquids the entire day-NO SOLID FOOD  2.  Do not drink anything colored red or purple.  Avoid juices with pulp.  No orange juice.  3.  Drink at least 64 oz. (8 glasses) of fluid/clear liquids during the day to prevent dehydration and help the prep work efficiently.  CLEAR LIQUIDS INCLUDE: Water Jello Ice Popsicles Tea (sugar ok, no milk/cream) Powdered fruit flavored drinks Coffee (sugar ok, no milk/cream) Gatorade Juice: apple, white grape, white cranberry  Lemonade Clear bullion, consomm, broth Carbonated beverages (any kind) Strained chicken noodle soup Hard Candy                             4.  In the morning, mix first dose of MoviPrep solution:    Empty 1 Pouch A and 1 Pouch B into the disposable container    Add lukewarm drinking water to the top line of the container. Mix to dissolve    Refrigerate (mixed solution should be used within 24 hrs)  5.  Begin drinking the prep at 5:00 p.m. The MoviPrep container is divided by 4 marks.   Every 15 minutes drink the solution down to the next mark (approximately 8 oz) until the full liter is complete.   6.  Follow completed prep with 16 oz of clear liquid of your choice  (Nothing red or purple).  Continue to drink clear liquids until bedtime.  7.  Before going to bed, mix second dose of MoviPrep solution:    Empty 1 Pouch A and 1 Pouch B into the disposable container    Add lukewarm drinking water to the top line of the container. Mix to dissolve    Refrigerate  THE DAY OF YOUR PROCEDURE      DATE: 02/03/11 DAY: Friday  Beginning at 5:00 a.m. (5 hours before procedure):         1. Every 15 minutes, drink the solution down to the next mark (approx 8 oz) until the full liter is complete.  2. Follow completed prep with 16 oz. of clear liquid of your choice.    3. You may drink clear liquids until 8:00am (2 HOURS BEFORE PROCEDURE).   MEDICATION INSTRUCTIONS  Unless otherwise instructed, you should take regular prescription medications with a small sip of water   as early as possible the morning of your procedure.  OTHER INSTRUCTIONS  You will need a responsible adult at least 46 years of age to accompany you and drive you home.   This person must remain in the waiting room during your procedure.  Wear loose fitting clothing that is easily removed.  Leave jewelry and other valuables at home.  However, you may wish to bring a book to read or  an iPod/MP3 player to listen to music as you wait for your procedure to start.  Remove all body piercing jewelry and leave at home.  Total time from sign-in until discharge is approximately 2-3 hours.  You should go home directly after your procedure and rest.  You can resume normal activities the  day after your procedure.  The day of your procedure you should not:   Drive   Make legal decisions   Operate machinery   Drink alcohol   Return to work  You will receive specific instructions about eating, activities and medications before you leave.    The above instructions have been reviewed and explained to me by   Estill Bamberg.     I fully understand and can verbalize these  instructions _____________________________ Date _________

## 2011-01-26 NOTE — Assessment & Plan Note (Signed)
Summary: Ulcerative colitis-6 month followup   History of Present Illness Visit Type: Follow-up Visit Primary GI MD: Scarlette Shorts MD Primary Provider: Kathleene Hazel MD Requesting Provider: n/a Chief Complaint: ulcerative colitis, small amount of blood on tissue; patient has a sore on finger that will not heal History of Present Illness:   45 year old white male with indeterminant colitis, thought to be ulcerative colitis, status post subtotal colectomy with residual 40 cm of the left colon and rectum remaining. He is intolerant to immunomodulators. His colitis is being treated with a combination of Pentasa 2 g b.i.d. and Remicade 5 mg per kilogram every 6 weeks. He was last evaluated in June 2001. See that dictation. Since that time he has been doing well. Clinically, he is unchanged. Still at 8-10 bowel movements per day which are mostly small and formed. Rare minor bleeding. Continues to believe that he is doing much better on Remicade. Ongoing problems with "gastric distress" felt likely due to chronic doxycycline therapy. No other GI complaints. Last colonoscopy in March 2011 revealing mild to moderate chronic active colitis. Rectal biopsies revealing granular atypia likely due to inflammation. Followup in one year recommended. No other GI complaints. He does tell me that he has a nonhealing lesion on his finger that he noticed after removing floor board with his father 6 weeks ago. He does have a dermatology appointment in a few weeks.. No fevers or other complaints.   GI Review of Systems    Reports abdominal pain and  nausea.     Location of  Abdominal pain: upper abdomen.    Denies acid reflux, belching, bloating, chest pain, dysphagia with liquids, dysphagia with solids, heartburn, loss of appetite, vomiting, vomiting blood, weight loss, and  weight gain.      Reports black tarry stools, diarrhea, and  rectal bleeding.     Denies anal fissure, change in bowel habit, constipation,  diverticulosis, fecal incontinence, heme positive stool, hemorrhoids, irritable bowel syndrome, jaundice, light color stool, liver problems, and  rectal pain.    Current Medications (verified): 1)  Mesalamine 4 Gm Enem (Mesalamine) .... Once Daily 2)  Cosopt 2-0.5 % Soln (Dorzolamide-Timolol) .... 2 Drops in Left Eye Once Daily 3)  Pentasa 500 Mg  Cpcr (Mesalamine) .... Four Tablets By Mouth Two Times A Day 4)  Ambien 10 Mg  Tabs (Zolpidem Tartrate) .Marland Kitchen.. 1 By Mouth At Bedtime As Needed 5)  Lotemax 0.5 %  Susp (Loteprednol Etabonate) .... One Drop in Each Once Daily 6)  Tylenol Extra Strength 500 Mg Tabs (Acetaminophen) .... As Needed 7)  Multivitamins  Tabs (Multiple Vitamin) .... Take 1 Tablet By Mouth Once A Day 8)  Imodium Advanced 2-125 Mg Tabs (Loperamide-Simethicone) .Marland Kitchen.. 1 Tablet By Mouth Once Daily As Needed 9)  Glucosamine-Chondroitin   Caps (Glucosamine-Chondroit-Vit C-Mn) .... Once Daily 10)  Tobramycin-Dexamethasone 0.3-0.1 % Susp (Tobramycin-Dexamethasone) .... One Drop in Left Eye Four Times A Day 11)  Remicade 100 Mg Solr (Infliximab) .... 5 Mg/kg Iv Q 6 Weeks 12)  Pilocarpine Hcl 1 % Soln (Pilocarpine Hcl) .Marland Kitchen.. 1 Drop in Left Eye Four Times A Day 13)  Briomadine Eye Drops .Marland KitchenMarland Kitchen. 1 Drop Two Times A Day in Left Eye  Allergies (verified): 1)  ! * Ivp Dye 2)  ! * Dimetapp  Past History:  Past Medical History: Reviewed history from 05/14/2008 and no changes required. Current Problems:  Visual impairment ULCERATIVE COLITIS (ICD-556.9)- Onset 1994 (Initial diagnosis In [place], [place]) INFLAMMATORY BOWEL DISEASE (ICD-558.9)  Past  Surgical History: Reviewed history from 07/30/2009 and no changes required. Status post subtotal colectomy with ileal colonic anastomosis August 2001. Status post ventral hernia repair left eye cornea transplant, repair after injury right eye cornea transplant laser surgery left eye surgery on left eyelid--07-28-2009  Family  History: Reviewed history from 01/15/2009 and no changes required. Family History of Colon Cancer: Maternal Grandfather Family History of Liver Cancer: Maternal Grandfather (mets from colon) Family History of Colitis/Crohn's: Father (colitis) Family History of Colon Polyps: Father, Mother Family History of Diabetes: Father, paternal grandmother Family History of Heart Disease: Father, Paternal Grandmother Family History of Breast Cancer:Cousin Family History of Prostate Cancer:Cousin  Social History: Reviewed history from 11/12/2008 and no changes required. The patient is single and resides at home. He is currently unemployed. Difficulty finding employment in maintaining employment due to his ongoing medical problems. He does not smoke. Has one sister. 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 Alcohol Use - no Daily Caffeine [id number] per week Illicit Drug Use - no Patient does not get regular exercise.   Review of Systems  The patient denies allergy/sinus, anemia, anxiety-new, arthritis/joint pain, back pain, blood in urine, breast changes/lumps, change in vision, confusion, cough, coughing up blood, depression-new, fainting, fatigue, fever, headaches-new, hearing problems, heart murmur, heart rhythm changes, itching, menstrual pain, muscle pains/cramps, night sweats, nosebleeds, pregnancy symptoms, shortness of breath, skin rash, sleeping problems, sore throat, swelling of feet/legs, swollen lymph glands, thirst - excessive , urination - excessive , urination changes/pain, urine leakage, vision changes, and voice change.    Vital Signs:  Patient profile:   45 year old male Height:      71 inches Weight:      263.13 pounds BMI:     36.83 Pulse rate:   100 / minute Pulse rhythm:   regular BP sitting:   122 / 80  (left arm) Cuff size:   regular  Vitals Entered By: June McMurray [place] Deborra Medina) (December 22, 2010 1:45 PM)  Physical Exam  General:  Well developed,obese, well nourished,  no acute distress. Head:  Normocephalic and atraumatic. Eyes:  enlarged fixed irregular left pupil unchanged Mouth:  No deformity or lesions, dentition normal. Neck:  Supple; no masses or thyromegaly. Lungs:  Clear throughout to auscultation. Heart:  Regular rate and rhythm; no murmurs, rubs,  or bruits. Abdomen:  Soft, nontender and nondistended. No masses, hepatosplenomegaly or hernias noted. Normal bowel sounds. Multiple surgical incisions well-healed. Rectal:  deferred until colonoscopy Msk:  Symmetrical with no gross deformities. Normal posture. Pulses:  Normal pulses noted. Extremities:  no edema Neurologic:  alert and oriented Skin:  third finger on left hand reveals focal lesion consistent with a wart Psych:  Alert and cooperative. Normal mood and affect.   Impression & Recommendations:  Problem # 1:  ULCERATIVE COLITIS-LEFT SIDE (ICD-556.5) stable on Remicade.  Plan:  #1. Continue Remicade 5 mg per kilogram every 6 weeks #2. Continue oral in topical mesalamine therapies #3. Due for a followup laboratories today including [place] [facility] panel #4. Schedule followup surveillance colonoscopy  Problem # 2:  wart on finger over-the-counter warts medicine. If no response keep dermatology appointment. If worsens, contact your dermatologist  Other Orders: TLB-BMP (Basic Metabolic Panel-BMET) ([id number]) TLB-Hepatic/Liver Function Pnl (80076-HEPATIC) TLB-CBC Platelet - w/Differential (85025-CBCD) TLB-TSH (Thyroid Stimulating Hormone) (97948-AXK) Colonoscopy (Colon)  Patient Instructions: 1)  Please go directly to the basement to have your labs drawn.  2)  Please continue current medications.  3)  Pick up your prep from your  pharmacy.  4)  Colonoscopy and Flexible Sigmoidoscopy brochure given.  5)  Copy sent to : Kathleene Hazel MD 6)  The medication list was reviewed and reconciled.  All changed / newly prescribed medications were explained.  A complete medication  list was provided to the patient / caregiver. Prescriptions: MOVIPREP 100 GM  SOLR (PEG-KCL-NACL-NASULF-NA ASC-C) As per prep instructions.  #1 x 0   Entered by:   Marlon Pel CMA ([place])   Authorized by:   Irene Shipper MD   Signed by:   Marlon Pel CMA ([place]) on 12/22/2010   Method used:   Electronically to        [facility] (retail)       [place], [place]  [address]       Ph: [phone]       Fax: [phone]   RxID:   [id number]

## 2011-01-26 NOTE — Progress Notes (Signed)
Summary: give info  Phone Note Call from Patient   Caller: mom Marena Chancy Call For: Dr [name] Summary of Call: Please send records to Karsten Ro MD [address] [place] [address] Fax [phone] Initial call taken by: Ronalee Red,  December 28, 2010 10:02 AM  Follow-up for Phone Call        Info .faxed as pt. has appt. there tomorrow to establish  and have further glucose testing. Follow-up by: Abel Presto RN,  December 28, 2010 10:28 AM

## 2011-01-26 NOTE — Letter (Signed)
[facility]: [facility]   Imported By: Phillis Knack 01/09/2011 08:02:15  _____________________________________________________________________  External Attachment:    Type:   Image     Comment:   External Document

## 2011-01-26 NOTE — Op Note (Signed)
Summary: Remicade/[place]  Remicade/   Imported By: Phillis Knack 12/09/2010 08:14:01  _____________________________________________________________________  External Attachment:    Type:   Image     Comment:   External Document

## 2011-01-26 NOTE — Medication Information (Signed)
Summary: Pentasa / [facility]  Pentasa / [facility]   Imported By: Rise Patience 12/30/2010 14:27:27  _____________________________________________________________________  External Attachment:    Type:   Image     Comment:   External Document

## 2011-02-01 NOTE — Progress Notes (Signed)
Summary: Remicade  Phone Note Call from Patient Call back at Home Phone [phone]   Caller: Patient Call For: Dr. [name] Reason for Call: Talk to Nurse Summary of Call: April at [facility] [phone], pt is there trying to get remicade and they needs to speak with nurse Initial call taken by: Martinique Johnson,  January 13, 2011 11:09 AM  Follow-up for Phone Call        I spoke with April at the [facility].  They will give the patient the stock supply this time.  Needs to reapply for the indigent Remicade.  I have advised April that I will pass this along to [facility], Dr [facility] nurse when she returns next week. Follow-up by: Barb Merino RN, [place],  January 13, 2011 11:18 AM  Additional Follow-up for Phone Call Additional follow up Details #1::        April is not there today so peggy ask that i call back tomorrow.   Abel Presto RN  January 16, 2011 3:26 PM [place] requested from Pt. assistance program and they will fax it.  Abel Presto RN  January 16, 2011 3:38 PM     Additional Follow-up for Phone Call Additional follow up Details #2::    Form was received and medication order for 01/13/11 treatment was faxed over. [facility] notified to be expecting the shipment of Remicade. Follow-up by: Rosanne Sack RN,  January 23, 2011 11:08 AM

## 2011-02-01 NOTE — Progress Notes (Signed)
Summary: Remicade

## 2011-02-01 NOTE — Progress Notes (Signed)
  Phone Note Other Incoming   Request: Send information Summary of Call: Request for records received from [place], Levora Dredge, [place], L.L.P. Request forwarded to [employer].  12/25/2004 to present-[place]

## 2011-02-03 ENCOUNTER — Other Ambulatory Visit (AMBULATORY_SURGERY_CENTER): Payer: Self-pay | Admitting: Internal Medicine

## 2011-02-03 ENCOUNTER — Other Ambulatory Visit: Payer: Self-pay | Admitting: Internal Medicine

## 2011-02-03 ENCOUNTER — Telehealth: Payer: Self-pay | Admitting: Internal Medicine

## 2011-02-03 DIAGNOSIS — K5289 Other specified noninfective gastroenteritis and colitis: Secondary | ICD-10-CM

## 2011-02-03 DIAGNOSIS — K5150 Left sided colitis without complications: Secondary | ICD-10-CM

## 2011-02-03 DIAGNOSIS — K6289 Other specified diseases of anus and rectum: Secondary | ICD-10-CM

## 2011-02-03 DIAGNOSIS — Z1211 Encounter for screening for malignant neoplasm of colon: Secondary | ICD-10-CM

## 2011-02-07 ENCOUNTER — Encounter: Payer: Self-pay | Admitting: Internal Medicine

## 2011-02-08 ENCOUNTER — Telehealth: Payer: Self-pay | Admitting: Internal Medicine

## 2011-02-15 NOTE — Letter (Signed)
Summary: Patient Notice- Colon Biospy Results  [facility]  [address] [place], [place] [address]   Phone: [phone]  Fax: [phone]        February 07, 2011 MRN: [id number]    [place], [place]  [address]    Dear Mr. Matthew [name],  I am pleased to inform you that the biopsies taken during your recent colonoscopy did not show any evidence of dysplasia cancer upon pathologic examination.  Additional information/recommendations:  __No further action is needed at this time.  Please follow-up with      your primary care physician for your other healthcare needs.  __Please call [phone] to schedule a return visit to review      your condition in 6 months.  __Continue with your current medications for colitis.  __You should have a repeat colonoscopy examination for this problem           in 2 years.    Please call us if you are having persistent problems or have questions about your condition that have not been fully answered at this time.  Sincerely,  Irene Shipper MD   This letter has been electronically signed by your physician.  Appended Document: Patient Notice- Colon Biospy Results Letter mailed  Appended Document: Patient Notice- Colon Biospy Results Letter mailed

## 2011-02-15 NOTE — Progress Notes (Signed)
Summary: speak to [place]  Phone Note Call from Patient Call back at [facility] Phone [phone]   Caller: mom  Call For: Dr [name] Reason for Call: Talk to Nurse Summary of Call: Mother Marena Chancy wants to speak to nurse regarding sons Remicade Initial call taken by: Ronalee Red,  February 03, 2011 2:10 PM  Follow-up for Phone Call        Vaughan Basta, I routed this to you since you are now doing the Remicade.  If there is anything I need to do, Please let me know.  Thanks.    Randye Lobo [facility]  February 03, 2011 2:51 PM   Spoke with Wells Guiles in [place], April had left for the day. She will check with April Monday and see if replacement Remicade has come in for the patient. Mother aware and I will call her on Monday with an update. Follow-up by: Rosanne Sack RN,  February 03, 2011 3:29 PM  Additional Follow-up for Phone Call Additional follow up Details #1::        Spoke with April in [place], shipment was received 01/25/11 and they will credit the patient for the drug and remove charge from her bill. Patients mother notified. Additional Follow-up by: Rosanne Sack RN,  February 07, 2011 9:32 AM

## 2011-02-15 NOTE — Procedures (Signed)
Summary: Colonoscopy   Colonoscopy  Procedure date:  02/03/2011  Findings:      Location:  [place].   COLONOSCOPY PROCEDURE REPORT  PATIENT:  Matthew [name], Matthew [name]  MR#:  [id number] BIRTHDATE:   January 27, 1966, 44 yrs. old   GENDER:   male ENDOSCOPIST:   Docia Chuck. Geri Seminole, MD REF. BY: Surveillance Program Recall, PROCEDURE DATE:  02/03/2011 PROCEDURE:  Colonoscopy with biopsy ASA CLASS:   Class II INDICATIONS: evaluation of ulcerative colitis, surveillance and high-risk screening ; last exam 02-2010 w/ active inflammation and atypia on bx.  On mesalamine and Remicade MEDICATIONS:    Fentanyl 50 mcg IV, Versed 5 mg IV  DESCRIPTION OF PROCEDURE:   After the risks benefits and alternatives of the procedure were thoroughly explained, informed consent was obtained.  Digital rectal exam was performed and revealed external hemorrhoids.   The LB [id number] endoscope was introduced through the anus and advanced to the anastomosis, without limitations.  The quality of the prep was excellent, using MoviPrep.  The instrument was then slowly withdrawn as the colon was fully examined. <<PROCEDUREIMAGES>>              <<OLD IMAGES>>  FINDINGS:  The terminal ileum appeared normal.  Mild Colitis was found in the left colon from 45cm (anastomosis) to 5cm in rectum. Random colon bx 4 < Q10cm (T=17). ? 52m sessile tissue in sigmoid (BX). Moderate distal rectal inflammation w/ ulceration (Bx X 5).  Retroflexed views in the rectum revealed colitis.    The scope was then withdrawn from the patient and the procedure completed.  COMPLICATIONS:   None ENDOSCOPIC IMPRESSION:  1) Normal neo-terminal ileum x 20cm  2) Mild active Colitis in the left colon  3) sessile tissue in sigmoid ? significance 4) Moderate rectal inflammation  RECOMMENDATIONS:  1) Await biopsy results  2) Continue current medications 3) Follow up to be determined  _______________________________ JDocia Chuck PGeri Seminole  MD  CC: The Patient; LGardiner Fanti   Appended Document: Colonoscopy recall 2 yrs     Procedures Next Due Date:    Colonoscopy: 01/2013

## 2011-02-15 NOTE — Progress Notes (Signed)
Summary: Medication  Phone Note Call from Patient   Caller: Linwood Dibbles Call For: Dr. [name] Reason for Call: Talk to Nurse Summary of Call: Linwood Dibbles from [facility] patient assistance is calling to verify patients dosage, you can reach her at  [phone] Initial call taken by: Martinique Johnson,  February 08, 2011 10:34 AM  Follow-up for Phone Call        Spoke with [facility] and verified Pentasa dose 525m 4 by mouth two times a day. Shipment to be sent to the home address. Follow-up by: LRosanne SackRN,  February 08, 2011 11:36 AM

## 2011-02-16 ENCOUNTER — Encounter: Payer: Self-pay | Admitting: Internal Medicine

## 2011-02-17 ENCOUNTER — Telehealth (INDEPENDENT_AMBULATORY_CARE_PROVIDER_SITE_OTHER): Payer: Self-pay

## 2011-02-21 NOTE — Progress Notes (Signed)
Summary: Remicade  ---- Converted from flag ---- ---- 01/23/2011 9:20 AM, Rosanne Sack RN wrote: order remicade ------------------------------       Additional Follow-up for Phone Call Additional follow up Details #2::    Remicade ordered for 02/24/11 infusion. Requested delivery for 02/21/11, notified April at [facility] for delivery. [phone] Follow-up by: Rosanne Sack RN,  February 17, 2011 9:08 AM

## 2011-02-24 ENCOUNTER — Encounter (HOSPITAL_COMMUNITY): Payer: Self-pay | Attending: Internal Medicine

## 2011-02-24 DIAGNOSIS — K5190 Ulcerative colitis, unspecified, without complications: Secondary | ICD-10-CM | POA: Insufficient documentation

## 2011-03-07 NOTE — Medication Information (Signed)
Summary: Ambien/[facility]  Ambien/[facility]   Imported By: Phillis Knack 02/27/2011 08:31:31  _____________________________________________________________________  External Attachment:    Type:   Image     Comment:   External Document

## 2011-03-14 ENCOUNTER — Other Ambulatory Visit: Payer: Self-pay | Admitting: Internal Medicine

## 2011-04-07 ENCOUNTER — Encounter (HOSPITAL_COMMUNITY): Payer: Self-pay | Attending: Internal Medicine

## 2011-04-07 DIAGNOSIS — K5190 Ulcerative colitis, unspecified, without complications: Secondary | ICD-10-CM | POA: Insufficient documentation

## 2011-05-12 NOTE — Assessment & Plan Note (Signed)
Matthew OFFICE NOTE   MALEKI, [name]                    MRN:          [id number]  DATE:12/06/2006                            DOB:          [birth year]    HISTORY:  Matthew [name] presents today with complaints of rectal bleeding.  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  He was last seen May 25, 2006 after recent  flare of disease.  Symptoms improved on oral and topical mesalamine.  He  did well until a few days ago when he began to notice some blood in the  toilet bowl, as well as the tissue.  Also slightly increased frequency  of bowel movements.  No abdominal pain, nausea, vomiting, or other  problems.  He states he feels better today, and has not had the  difficulties.  He has had 30 pound weight loss since his last office  visit, which he attributes to diet and increased activity.  He is quite  proud.   CURRENT MEDICATIONS:  1. Pentasa 1 gm p.o. q.i.d.  2. Mesalamine enemas at night.  3. Betimol eye drops.  4. zotemax  eye drops.   PHYSICAL EXAMINATION:  Finds a well-appearing male in no acute distress.  Blood pressure is 120/70, heart rate is 70, weight is 246 pounds.  ABDOMEN:  Soft without tenderness, mass, or hernia.  Good bowel sounds  heard.   IMPRESSION:  Indeterminate colitis, status post subtotal colectomy.  Past several days with increased frequency of bowel movements and  bleeding.  Appears to be minor flare.   RECOMMENDATIONS:  1. Obtain CBC today.  2. Continue mesalamine, both oral and topical.  3. Given a prescription for Cort enemas to be used if the current      problem persists.  4. Routine office followup in 6 months, unless interval questions or      problems.     Docia Chuck. [name], MD  Electronically Signed    JNP/MedQ  DD: 12/06/2006  DT: 12/06/2006  Job #: [id number]   cc:   Kathleene Hazel

## 2011-05-19 ENCOUNTER — Encounter (HOSPITAL_COMMUNITY): Payer: Self-pay | Attending: Internal Medicine

## 2011-05-19 DIAGNOSIS — K5190 Ulcerative colitis, unspecified, without complications: Secondary | ICD-10-CM | POA: Insufficient documentation

## 2011-06-26 ENCOUNTER — Other Ambulatory Visit: Payer: Self-pay | Admitting: Internal Medicine

## 2011-06-26 DIAGNOSIS — K5150 Left sided colitis without complications: Secondary | ICD-10-CM

## 2011-06-26 MED ORDER — INFLIXIMAB 100 MG IV SOLR: INTRAVENOUS | Status: DC

## 2011-06-30 ENCOUNTER — Encounter (HOSPITAL_COMMUNITY): Payer: Self-pay | Attending: Internal Medicine

## 2011-06-30 DIAGNOSIS — K5190 Ulcerative colitis, unspecified, without complications: Secondary | ICD-10-CM | POA: Insufficient documentation

## 2011-07-31 ENCOUNTER — Ambulatory Visit (INDEPENDENT_AMBULATORY_CARE_PROVIDER_SITE_OTHER): Payer: Self-pay | Admitting: Internal Medicine

## 2011-07-31 ENCOUNTER — Encounter: Payer: Self-pay | Admitting: Internal Medicine

## 2011-07-31 DIAGNOSIS — K5190 Ulcerative colitis, unspecified, without complications: Secondary | ICD-10-CM

## 2011-08-02 LAB — TB SKIN TEST: TB Skin Test: NEGATIVE mm

## 2011-08-07 ENCOUNTER — Telehealth: Payer: Self-pay | Admitting: Internal Medicine

## 2011-08-07 NOTE — Telephone Encounter (Signed)
Pts mother concerned because they received a bill for the remicade for 6,900. Called main pharmacy and spoke with Aundra Dubin. Per Ivin Booty this is an error and she will take care of correcting this. Pts mother aware.

## 2011-08-11 ENCOUNTER — Encounter (HOSPITAL_COMMUNITY): Payer: Self-pay | Attending: Internal Medicine

## 2011-08-11 DIAGNOSIS — K5190 Ulcerative colitis, unspecified, without complications: Secondary | ICD-10-CM | POA: Insufficient documentation

## 2011-09-19 ENCOUNTER — Telehealth: Payer: Self-pay | Admitting: Internal Medicine

## 2011-09-19 NOTE — Telephone Encounter (Signed)
Spoke with company and they faxed over the renewal paperwork for the medication. Will fax paperwork in for medication. Ms. Sedivy aware.

## 2011-09-22 ENCOUNTER — Encounter (HOSPITAL_COMMUNITY)
Admission: RE | Admit: 2011-09-22 | Discharge: 2011-09-22 | Disposition: A | Discharge status: Home / Self Care | Payer: Self-pay | Source: Ambulatory Visit | Attending: Internal Medicine | Admitting: Internal Medicine

## 2011-09-22 DIAGNOSIS — K5190 Ulcerative colitis, unspecified, without complications: Secondary | ICD-10-CM | POA: Insufficient documentation

## 2011-09-27 LAB — GLUCOSE, CAPILLARY: Glucose-Capillary: 133 — ABNORMAL HIGH

## 2011-10-13 DIAGNOSIS — H402220 Chronic angle-closure glaucoma, left eye, stage unspecified: Secondary | ICD-10-CM | POA: Insufficient documentation

## 2011-10-13 DIAGNOSIS — H01003 Unspecified blepharitis right eye, unspecified eyelid: Secondary | ICD-10-CM | POA: Insufficient documentation

## 2011-10-13 DIAGNOSIS — H18603 Keratoconus, unspecified, bilateral: Secondary | ICD-10-CM | POA: Insufficient documentation

## 2011-10-13 DIAGNOSIS — Z947 Corneal transplant status: Secondary | ICD-10-CM | POA: Insufficient documentation

## 2011-10-23 ENCOUNTER — Encounter: Payer: Self-pay | Admitting: Internal Medicine

## 2011-10-23 ENCOUNTER — Other Ambulatory Visit: Payer: Self-pay | Admitting: Gastroenterology

## 2011-10-23 DIAGNOSIS — K5190 Ulcerative colitis, unspecified, without complications: Secondary | ICD-10-CM

## 2011-10-24 ENCOUNTER — Other Ambulatory Visit: Payer: Self-pay | Admitting: Internal Medicine

## 2011-10-24 DIAGNOSIS — K5190 Ulcerative colitis, unspecified, without complications: Secondary | ICD-10-CM

## 2011-10-27 ENCOUNTER — Other Ambulatory Visit: Payer: Self-pay | Admitting: Internal Medicine

## 2011-10-28 MED ORDER — ACETAMINOPHEN 325 MG PO TABS: 650.0000 mg | ORAL_TABLET | ORAL | Status: DC

## 2011-10-28 MED ORDER — DIPHENHYDRAMINE HCL 25 MG PO CAPS: 50.0000 mg | ORAL_CAPSULE | ORAL | Status: DC

## 2011-11-01 ENCOUNTER — Encounter (HOSPITAL_COMMUNITY): Payer: Self-pay | Admitting: Internal Medicine

## 2011-11-01 ENCOUNTER — Encounter (HOSPITAL_COMMUNITY): Payer: Self-pay | Attending: Internal Medicine

## 2011-11-01 DIAGNOSIS — K5190 Ulcerative colitis, unspecified, without complications: Secondary | ICD-10-CM | POA: Insufficient documentation

## 2011-11-01 MED ORDER — SODIUM CHLORIDE 0.9 % IV SOLN
INTRAVENOUS | Status: DC
  Administered 2011-11-01: 11:00:00 via INTRAVENOUS

## 2011-11-01 MED ORDER — ACETAMINOPHEN 325 MG PO TABS: 650.0000 mg | ORAL_TABLET | Freq: Once | ORAL | Status: DC

## 2011-11-01 MED ORDER — INFLIXIMAB 100 MG IV SOLR
5.0000 mg/kg | INTRAVENOUS | Status: AC
  Administered 2011-11-01: 600 mg via INTRAVENOUS
  Filled 2011-11-01: qty 60

## 2011-11-01 MED ORDER — ACETAMINOPHEN 325 MG PO TABS
650.0000 mg | ORAL_TABLET | Freq: Once | ORAL | Status: AC
  Administered 2011-11-01: 650 mg via ORAL

## 2011-11-01 MED ORDER — INFLIXIMAB 100 MG IV SOLR: 5.0000 mg/kg | INTRAVENOUS | Status: DC

## 2011-11-01 MED ORDER — DIPHENHYDRAMINE HCL 25 MG PO CAPS: 50.0000 mg | ORAL_CAPSULE | Freq: Once | ORAL | Status: DC

## 2011-11-01 MED ORDER — DIPHENHYDRAMINE HCL 25 MG PO CAPS
50.0000 mg | ORAL_CAPSULE | Freq: Once | ORAL | Status: AC
  Administered 2011-11-01: 50 mg via ORAL

## 2011-11-01 NOTE — Patient Instructions (Signed)
Infliximab injection What is this medicine? INFLIXIMAB (in [place] i mab) is used to treat Crohn's disease and ulcerative colitis. It is also used to treat ankylosing spondylitis, psoriasis, and some forms of arthritis. This medicine may be used for other purposes; ask your health care provider or pharmacist if you have questions. What should I tell my health care provider before I take this medicine? They need to know if you have any of these conditions: -diabetes -exposure to tuberculosis -heart failure -hepatitis or liver disease -immune system problems -infection -lung or breathing disease, like COPD -multiple sclerosis -current or past resident of [place] or [place] -seizure disorder -an unusual or allergic reaction to infliximab, mouse proteins, other medicines, foods, dyes, or preservatives -pregnant or trying to get pregnant -breast-feeding How should I use this medicine? This medicine is for injection into a vein. It is usually given by a health care professional in a hospital or clinic setting. A special MedGuide will be given to you by the pharmacist with each prescription and refill. Be sure to read this information carefully each time. Talk to your pediatrician regarding the use of this medicine in children. Special care may be needed. Overdosage: If you think you have taken too much of this medicine contact a poison control center or emergency room at once. NOTE: This medicine is only for you. Do not share this medicine with others. What if I miss a dose? It is important not to miss your dose. Call your doctor or health care professional if you are unable to keep an appointment. What may interact with this medicine? Do not take this medicine with any of the following medications: -anakinra -rilonacept This medicine may also interact with the following medications: -vaccines This list may not describe all possible interactions. Give your health care provider  a list of all the medicines, herbs, non-prescription drugs, or dietary supplements you use. Also tell them if you smoke, drink alcohol, or use illegal drugs. Some items may interact with your medicine. What should I watch for while using this medicine? Visit your doctor or health care professional for regular checks on your progress. If you get a cold or other infection while receiving this medicine, call your doctor or health care professional. Do not treat yourself. This medicine may decrease your body's ability to fight infections. Before beginning therapy, your doctor may do a test to see if you have been exposed to tuberculosis. This medicine may make the symptoms of heart failure worse in some patients. If you notice symptoms such as increased shortness of breath or swelling of the ankles or legs, contact your health care provider right away. If you are going to have surgery or dental work, tell your health care professional or dentist that you have received this medicine. If you take this medicine for plaque psoriasis, stay out of the sun. If you cannot avoid being in the sun, wear protective clothing and use sunscreen. Do not use sun lamps or tanning beds/booths. What side effects may I notice from receiving this medicine? Side effects that you should report to your doctor or health care professional as soon as possible: -allergic reactions like skin rash, itching or hives, swelling of the face, lips, or tongue -chest pain -fever or chills, usually related to the infusion -muscle or joint pain -red, scaly patches or raised bumps on the skin -signs of infection - fever or chills, cough, sore throat, pain or difficulty passing urine -swollen lymph nodes in the neck, underarm,  or groin areas -unexplained weight loss -unusual bleeding or bruising -unusually weak or tired -yellowing of the eyes or skin Side effects that usually do not require medical attention (report to your doctor or health  care professional if they continue or are bothersome): -headache -heartburn or stomach pain -nausea, vomiting This list may not describe all possible side effects. Call your doctor for medical advice about side effects. You may report side effects to FDA at 1-800-FDA-1088. Where should I keep my medicine? This drug is given in a hospital or clinic and will not be stored at home. NOTE: This sheet is a summary. It may not cover all possible information. If you have questions about this medicine, talk to your doctor, pharmacist, or health care provider.  2012, Elsevier/Gold Standard. (07/29/2008 10:26:02 AM)

## 2011-11-03 ENCOUNTER — Encounter (HOSPITAL_COMMUNITY): Payer: Self-pay

## 2011-11-24 NOTE — Telephone Encounter (Signed)
Completed 11/01/2011

## 2011-12-15 ENCOUNTER — Encounter (HOSPITAL_COMMUNITY): Payer: Self-pay

## 2011-12-15 ENCOUNTER — Encounter (HOSPITAL_COMMUNITY)
Admission: RE | Admit: 2011-12-15 | Discharge: 2011-12-15 | Disposition: A | Discharge status: Home / Self Care | Payer: Self-pay | Source: Ambulatory Visit | Attending: Internal Medicine | Admitting: Internal Medicine

## 2011-12-15 DIAGNOSIS — K5190 Ulcerative colitis, unspecified, without complications: Secondary | ICD-10-CM | POA: Insufficient documentation

## 2011-12-15 MED ORDER — ACETAMINOPHEN 325 MG PO TABS
650.0000 mg | ORAL_TABLET | Freq: Once | ORAL | Status: AC
  Administered 2011-12-15: 650 mg via ORAL

## 2011-12-15 MED ORDER — SODIUM CHLORIDE 0.9 % IV SOLN
INTRAVENOUS | Status: DC
  Administered 2011-12-15: 250 mL via INTRAVENOUS

## 2011-12-15 MED ORDER — DIPHENHYDRAMINE HCL 25 MG PO TABS
50.0000 mg | ORAL_TABLET | Freq: Once | ORAL | Status: AC
  Administered 2011-12-15: 50 mg via ORAL
  Filled 2011-12-15: qty 2

## 2011-12-15 MED ORDER — SODIUM CHLORIDE 0.9 % IV SOLN
5.0000 mg/kg | INTRAVENOUS | Status: AC
  Administered 2011-12-15: 600 mg via INTRAVENOUS
  Filled 2011-12-15: qty 60

## 2012-01-22 ENCOUNTER — Encounter: Payer: Self-pay | Admitting: Internal Medicine

## 2012-01-22 ENCOUNTER — Ambulatory Visit (INDEPENDENT_AMBULATORY_CARE_PROVIDER_SITE_OTHER): Payer: Self-pay | Admitting: Internal Medicine

## 2012-01-22 DIAGNOSIS — K5190 Ulcerative colitis, unspecified, without complications: Secondary | ICD-10-CM

## 2012-01-22 DIAGNOSIS — G4700 Insomnia, unspecified: Secondary | ICD-10-CM

## 2012-01-22 MED ORDER — ZOLPIDEM TARTRATE 10 MG PO TABS: 10.0000 mg | ORAL_TABLET | Freq: Every evening | ORAL | Status: DC | PRN

## 2012-01-22 NOTE — Patient Instructions (Signed)
We have given you a prescription for the following medications to your pharmacy for you to pick up at your convenience:  Ambien  Please follow up with Dr. [name] in one year

## 2012-01-22 NOTE — Progress Notes (Signed)
HISTORY OF PRESENT ILLNESS:  Matthew [name] is a 46 y.o. 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 He is intolerant to immunomodulators. His colitis is being treated with a combination of oral mesalamine and Remicade 5 mg per kilogram every 6 weeks. He was last seen in the office December 2011. See that dictation. He subsequently underwent colonoscopy February 2012. Mild active colitis noted. He is continued on his medications without change. He is 6 months overdue for followup. He is accompanied by his father. Overall, he reports doing better since his last visit. He still has about 8 bowel movements per day. No significant bleeding. Chronic unchanged abdominal discomfort. He does request Ambien for sleep.  REVIEW OF SYSTEMS:  All non-GI ROS negative except for visual problems and insomnia  Past Medical History  Diagnosis Date  . Ulcerative colitis, unspecified   . Glaucoma   . Primary generalized (osteo)arthritis   . Inflammatory bowel disease   . Colon polyps     2007    Past Surgical History  Procedure Date  . Ventral hernia repair   . Colon resection   . Eye surgery   . Cornea transplant both eyes     Social History Matthew [name]  reports that he has never smoked. He has never used smokeless tobacco. He reports that he does not drink alcohol or use illicit drugs.  family history includes Breast cancer in his cousin; Colitis in his father; Colon cancer in his maternal grandfather; Colon polyps in his father and mother; Heart disease in his father and paternal grandmother; Liver cancer in his maternal grandfather; and Prostate cancer in his cousin.  Allergies  Allergen Reactions  . Contrast Media (Iodinated Diagnostic Agents) Hives  . Dimetapp Allergy Sinus (Brompheniramine-Ppa-Apap) Hives       PHYSICAL  EXAMINATION: Vital signs: BP 142/84  Pulse 102  Ht 5' 11.5" (1.816 m)  Wt 270 lb 12.8 oz (122.834 kg)  BMI 37.24 kg/m2 General: Well-developed, well-nourished, no acute distress HEENT: Sclerae are anicteric, conjunctiva pink. Irregular pupils left eye. Oral mucosa intact Lungs: Clear Heart: Regular Abdomen: soft,obese, nontender, nondistended, no obvious ascites, no peritoneal signs, normal bowel sounds. No organomegaly. Multiple prior surgical incisions well-healed. Extremities: No edema Psychiatric: alert and oriented x3. Cooperative    ASSESSMENT:  #1. A sided ulcerative colitis. Stable on Remicade and mesalamine therapies #2. Insomnia   PLAN:  #1.continue current therapies for ulcerative colitis #2. Refill Ambien as Requested #3. Routine GI followup in one year. Sooner if clinically indicated.

## 2012-01-26 ENCOUNTER — Encounter (HOSPITAL_COMMUNITY): Payer: Self-pay

## 2012-01-26 ENCOUNTER — Encounter (HOSPITAL_COMMUNITY)
Admission: RE | Admit: 2012-01-26 | Discharge: 2012-01-26 | Disposition: A | Discharge status: Home / Self Care | Payer: Self-pay | Source: Ambulatory Visit | Attending: Internal Medicine | Admitting: Internal Medicine

## 2012-01-26 DIAGNOSIS — K5190 Ulcerative colitis, unspecified, without complications: Secondary | ICD-10-CM | POA: Insufficient documentation

## 2012-01-26 MED ORDER — ACETAMINOPHEN 325 MG PO TABS
650.0000 mg | ORAL_TABLET | Freq: Once | ORAL | Status: AC
  Administered 2012-01-26: 650 mg via ORAL

## 2012-01-26 MED ORDER — SODIUM CHLORIDE 0.9 % IV SOLN: 5.0000 mg/kg | INTRAVENOUS | Status: DC

## 2012-01-26 MED ORDER — SODIUM CHLORIDE 0.9 % IV SOLN
INTRAVENOUS | Status: DC
  Administered 2012-01-26: 11:00:00 via INTRAVENOUS

## 2012-01-26 MED ORDER — SODIUM CHLORIDE 0.9 % IV SOLN
600.0000 mg | INTRAVENOUS | Status: AC
  Administered 2012-01-26: 600 mg via INTRAVENOUS
  Filled 2012-01-26: qty 60

## 2012-01-26 MED ORDER — DIPHENHYDRAMINE HCL 25 MG PO TABS
50.0000 mg | ORAL_TABLET | Freq: Once | ORAL | Status: AC
  Administered 2012-01-26: 50 mg via ORAL
  Filled 2012-01-26: qty 2

## 2012-03-07 ENCOUNTER — Telehealth: Payer: Self-pay | Admitting: Internal Medicine

## 2012-03-07 ENCOUNTER — Other Ambulatory Visit: Payer: Self-pay | Admitting: Internal Medicine

## 2012-03-07 NOTE — Telephone Encounter (Signed)
Pts mother received bill for Remicade drug which is supposed to be free drug. Called [facility] main pharmacy and spoke with Franchot Erichsen. She states she will speak with her supervisor about crediting the amount for the remicade.

## 2012-03-08 ENCOUNTER — Encounter (HOSPITAL_COMMUNITY)
Admission: RE | Admit: 2012-03-08 | Discharge: 2012-03-08 | Disposition: A | Discharge status: Home / Self Care | Payer: Self-pay | Source: Ambulatory Visit | Attending: Internal Medicine | Admitting: Internal Medicine

## 2012-03-08 DIAGNOSIS — K5090 Crohn's disease, unspecified, without complications: Secondary | ICD-10-CM | POA: Insufficient documentation

## 2012-03-08 MED ORDER — SODIUM CHLORIDE 0.9 % IV SOLN
5.0000 mg/kg | Freq: Once | INTRAVENOUS | Status: AC
  Administered 2012-03-08: 600 mg via INTRAVENOUS
  Filled 2012-03-08: qty 60

## 2012-03-08 MED ORDER — SODIUM CHLORIDE 0.9 % IV SOLN
INTRAVENOUS | Status: DC
  Administered 2012-03-08: 12:00:00 via INTRAVENOUS

## 2012-03-08 MED ORDER — ACETAMINOPHEN 325 MG PO TABS
650.0000 mg | ORAL_TABLET | Freq: Once | ORAL | Status: AC
  Administered 2012-03-08: 650 mg via ORAL

## 2012-03-08 MED ORDER — DIPHENHYDRAMINE HCL 25 MG PO CAPS
50.0000 mg | ORAL_CAPSULE | Freq: Once | ORAL | Status: AC
  Administered 2012-03-08: 50 mg via ORAL

## 2012-03-08 NOTE — Discharge Instructions (Signed)
Refer to printed sheet for next appointment. Short Stay Phone # [phone]   NEXT APPOINTMENTS 04/19/12 1100 FRIDAY 05/31/12   1100 FRIDAYInfliximab injection What is this medicine? INFLIXIMAB (in [place] i mab) is used to treat Crohn's disease and ulcerative colitis. It is also used to treat ankylosing spondylitis, psoriasis, and some forms of arthritis. This medicine may be used for other purposes; ask your health care provider or pharmacist if you have questions. What should I tell my health care provider before I take this medicine? They need to know if you have any of these conditions: -diabetes -exposure to tuberculosis -heart failure -hepatitis or liver disease -immune system problems -infection -lung or breathing disease, like COPD -multiple sclerosis -current or past resident of [place] or [place] -seizure disorder -an unusual or allergic reaction to infliximab, mouse proteins, other medicines, foods, dyes, or preservatives -pregnant or trying to get pregnant -breast-feeding How should I use this medicine? This medicine is for injection into a vein. It is usually given by a health care professional in a hospital or clinic setting. A special MedGuide will be given to you by the pharmacist with each prescription and refill. Be sure to read this information carefully each time. Talk to your pediatrician regarding the use of this medicine in children. Special care may be needed. Overdosage: If you think you have taken too much of this medicine contact a poison control center or emergency room at once. NOTE: This medicine is only for you. Do not share this medicine with others. What if I miss a dose? It is important not to miss your dose. Call your doctor or health care professional if you are unable to keep an appointment. What may interact with this medicine? Do not take this medicine with any of the following medications: -anakinra -rilonacept This medicine may also  interact with the following medications: -vaccines This list may not describe all possible interactions. Give your health care provider a list of all the medicines, herbs, non-prescription drugs, or dietary supplements you use. Also tell them if you smoke, drink alcohol, or use illegal drugs. Some items may interact with your medicine. What should I watch for while using this medicine? Visit your doctor or health care professional for regular checks on your progress. If you get a cold or other infection while receiving this medicine, call your doctor or health care professional. Do not treat yourself. This medicine may decrease your body's ability to fight infections. Before beginning therapy, your doctor may do a test to see if you have been exposed to tuberculosis. This medicine may make the symptoms of heart failure worse in some patients. If you notice symptoms such as increased shortness of breath or swelling of the ankles or legs, contact your health care provider right away. If you are going to have surgery or dental work, tell your health care professional or dentist that you have received this medicine. If you take this medicine for plaque psoriasis, stay out of the sun. If you cannot avoid being in the sun, wear protective clothing and use sunscreen. Do not use sun lamps or tanning beds/booths. What side effects may I notice from receiving this medicine? Side effects that you should report to your doctor or health care professional as soon as possible: -allergic reactions like skin rash, itching or hives, swelling of the face, lips, or tongue -chest pain -fever or chills, usually related to the infusion -muscle or joint pain -red, scaly patches or raised bumps on  the skin -signs of infection - fever or chills, cough, sore throat, pain or difficulty passing urine -swollen lymph nodes in the neck, underarm, or groin areas -unexplained weight loss -unusual bleeding or bruising -unusually  weak or tired -yellowing of the eyes or skin Side effects that usually do not require medical attention (report to your doctor or health care professional if they continue or are bothersome): -headache -heartburn or stomach pain -nausea, vomiting This list may not describe all possible side effects. Call your doctor for medical advice about side effects. You may report side effects to FDA at 1-800-FDA-1088. Where should I keep my medicine? This drug is given in a hospital or clinic and will not be stored at home. NOTE: This sheet is a summary. It may not cover all possible information. If you have questions about this medicine, talk to your doctor, pharmacist, or health care provider.  2012, Elsevier/Gold Standard. (07/29/2008 10:26:02 AM)

## 2012-04-04 ENCOUNTER — Telehealth: Payer: Self-pay | Admitting: Internal Medicine

## 2012-04-04 NOTE — Telephone Encounter (Signed)
[facility] called to verify dosage of Pentasa.  They will mail the rx to the patient

## 2012-04-08 ENCOUNTER — Telehealth: Payer: Self-pay | Admitting: Internal Medicine

## 2012-04-08 NOTE — Telephone Encounter (Signed)
Pts mother called and states that she received a bill for his last Remicade infusion 03/08/12. Called and spoke with Franchot Erichsen at [facility]. She will have the bill credited for the Remicade.

## 2012-04-19 ENCOUNTER — Encounter (HOSPITAL_COMMUNITY)
Admission: RE | Admit: 2012-04-19 | Discharge: 2012-04-19 | Disposition: A | Discharge status: Home / Self Care | Payer: Self-pay | Source: Ambulatory Visit | Attending: Internal Medicine | Admitting: Internal Medicine

## 2012-04-19 ENCOUNTER — Encounter (HOSPITAL_COMMUNITY): Payer: Self-pay

## 2012-04-19 DIAGNOSIS — K5190 Ulcerative colitis, unspecified, without complications: Secondary | ICD-10-CM | POA: Insufficient documentation

## 2012-04-19 MED ORDER — SODIUM CHLORIDE 0.9 % IV SOLN
5.0000 mg/kg | Freq: Once | INTRAVENOUS | Status: AC
  Administered 2012-04-19: 600 mg via INTRAVENOUS
  Filled 2012-04-19: qty 60

## 2012-04-19 MED ORDER — DIPHENHYDRAMINE HCL 25 MG PO CAPS
ORAL_CAPSULE | ORAL | Status: AC
  Administered 2012-04-19: 50 mg via ORAL
  Filled 2012-04-19: qty 2

## 2012-04-19 MED ORDER — SODIUM CHLORIDE 0.9 % IV SOLN
INTRAVENOUS | Status: DC
  Administered 2012-04-19: 12:00:00 via INTRAVENOUS

## 2012-04-19 MED ORDER — DIPHENHYDRAMINE HCL 25 MG PO CAPS
50.0000 mg | ORAL_CAPSULE | Freq: Once | ORAL | Status: AC
  Administered 2012-04-19: 50 mg via ORAL

## 2012-04-19 MED ORDER — ACETAMINOPHEN 325 MG PO TABS
650.0000 mg | ORAL_TABLET | Freq: Once | ORAL | Status: AC
  Administered 2012-04-19: 650 mg via ORAL

## 2012-04-19 MED ORDER — ACETAMINOPHEN 325 MG PO TABS
ORAL_TABLET | ORAL | Status: AC
  Administered 2012-04-19: 650 mg via ORAL
  Filled 2012-04-19: qty 2

## 2012-04-19 NOTE — Discharge Instructions (Signed)
Infliximab injection What is this medicine? INFLIXIMAB (in [place] i mab) is used to treat Crohn's disease and ulcerative colitis. It is also used to treat ankylosing spondylitis, psoriasis, and some forms of arthritis. This medicine may be used for other purposes; ask your health care provider or pharmacist if you have questions. What should I tell my health care provider before I take this medicine? They need to know if you have any of these conditions: -diabetes -exposure to tuberculosis -heart failure -hepatitis or liver disease -immune system problems -infection -lung or breathing disease, like COPD -multiple sclerosis -current or past resident of [place] or [place] -seizure disorder -an unusual or allergic reaction to infliximab, mouse proteins, other medicines, foods, dyes, or preservatives -pregnant or trying to get pregnant -breast-feeding How should I use this medicine? This medicine is for injection into a vein. It is usually given by a health care professional in a hospital or clinic setting. A special MedGuide will be given to you by the pharmacist with each prescription and refill. Be sure to read this information carefully each time. Talk to your pediatrician regarding the use of this medicine in children. Special care may be needed. Overdosage: If you think you have taken too much of this medicine contact a poison control center or emergency room at once. NOTE: This medicine is only for you. Do not share this medicine with others. What if I miss a dose? It is important not to miss your dose. Call your doctor or health care professional if you are unable to keep an appointment. What may interact with this medicine? Do not take this medicine with any of the following medications: -anakinra -rilonacept This medicine may also interact with the following medications: -vaccines This list may not describe all possible interactions. Give your health care provider  a list of all the medicines, herbs, non-prescription drugs, or dietary supplements you use. Also tell them if you smoke, drink alcohol, or use illegal drugs. Some items may interact with your medicine. What should I watch for while using this medicine? Visit your doctor or health care professional for regular checks on your progress. If you get a cold or other infection while receiving this medicine, call your doctor or health care professional. Do not treat yourself. This medicine may decrease your body's ability to fight infections. Before beginning therapy, your doctor may do a test to see if you have been exposed to tuberculosis. This medicine may make the symptoms of heart failure worse in some patients. If you notice symptoms such as increased shortness of breath or swelling of the ankles or legs, contact your health care provider right away. If you are going to have surgery or dental work, tell your health care professional or dentist that you have received this medicine. If you take this medicine for plaque psoriasis, stay out of the sun. If you cannot avoid being in the sun, wear protective clothing and use sunscreen. Do not use sun lamps or tanning beds/booths. What side effects may I notice from receiving this medicine? Side effects that you should report to your doctor or health care professional as soon as possible: -allergic reactions like skin rash, itching or hives, swelling of the face, lips, or tongue -chest pain -fever or chills, usually related to the infusion -muscle or joint pain -red, scaly patches or raised bumps on the skin -signs of infection - fever or chills, cough, sore throat, pain or difficulty passing urine -swollen lymph nodes in the neck, underarm,  or groin areas -unexplained weight loss -unusual bleeding or bruising -unusually weak or tired -yellowing of the eyes or skin Side effects that usually do not require medical attention (report to your doctor or health  care professional if they continue or are bothersome): -headache -heartburn or stomach pain -nausea, vomiting This list may not describe all possible side effects. Call your doctor for medical advice about side effects. You may report side effects to FDA at 1-800-FDA-1088. Where should I keep my medicine? This drug is given in a hospital or clinic and will not be stored at home. NOTE: This sheet is a summary. It may not cover all possible information. If you have questions about this medicine, talk to your doctor, pharmacist, or health care provider.  2012, Elsevier/Gold Standard. (07/29/2008 10:26:02 AM)

## 2012-04-22 ENCOUNTER — Telehealth: Payer: Self-pay | Admitting: Internal Medicine

## 2012-04-23 ENCOUNTER — Telehealth: Payer: Self-pay | Admitting: Internal Medicine

## 2012-04-24 ENCOUNTER — Other Ambulatory Visit: Payer: Self-pay | Admitting: Internal Medicine

## 2012-04-24 MED ORDER — ZOLPIDEM TARTRATE 10 MG PO TABS: 10.0000 mg | ORAL_TABLET | Freq: Every evening | ORAL | Status: DC | PRN

## 2012-04-24 NOTE — Telephone Encounter (Signed)
Left message with patient's father that rx was ready to be picked up; he stated patient was on his way

## 2012-04-26 NOTE — Telephone Encounter (Signed)
New Phone opened. Script done for pt.

## 2012-05-27 ENCOUNTER — Other Ambulatory Visit: Payer: Self-pay | Admitting: Internal Medicine

## 2012-05-30 ENCOUNTER — Other Ambulatory Visit: Payer: Self-pay

## 2012-05-30 DIAGNOSIS — K5190 Ulcerative colitis, unspecified, without complications: Secondary | ICD-10-CM

## 2012-05-31 ENCOUNTER — Encounter (HOSPITAL_COMMUNITY): Payer: Self-pay

## 2012-05-31 ENCOUNTER — Encounter (HOSPITAL_COMMUNITY)
Admission: RE | Admit: 2012-05-31 | Discharge: 2012-05-31 | Disposition: A | Discharge status: Home / Self Care | Payer: Self-pay | Source: Ambulatory Visit | Attending: Internal Medicine | Admitting: Internal Medicine

## 2012-05-31 VITALS — BP 125/83 | HR 83 | Temp 97.8°F | Resp 18 | Ht 71.5 in | Wt 270.0 lb

## 2012-05-31 DIAGNOSIS — K5190 Ulcerative colitis, unspecified, without complications: Secondary | ICD-10-CM | POA: Insufficient documentation

## 2012-05-31 MED ORDER — ACETAMINOPHEN 325 MG PO TABS
650.0000 mg | ORAL_TABLET | ORAL | Status: DC
  Administered 2012-05-31: 650 mg via ORAL

## 2012-05-31 MED ORDER — SODIUM CHLORIDE 0.9 % IV SOLN
INTRAVENOUS | Status: DC
  Administered 2012-05-31: 11:00:00 via INTRAVENOUS

## 2012-05-31 MED ORDER — INFLIXIMAB 100 MG IV SOLR
5.0000 mg/kg | INTRAVENOUS | Status: DC
  Administered 2012-05-31: 600 mg via INTRAVENOUS
  Filled 2012-05-31: qty 60

## 2012-05-31 MED ORDER — ACETAMINOPHEN 325 MG PO TABS
ORAL_TABLET | ORAL | Status: AC
  Administered 2012-05-31: 650 mg via ORAL
  Filled 2012-05-31: qty 2

## 2012-05-31 MED ORDER — DIPHENHYDRAMINE HCL 25 MG PO CAPS
ORAL_CAPSULE | ORAL | Status: AC
  Administered 2012-05-31: 50 mg via ORAL
  Filled 2012-05-31: qty 2

## 2012-05-31 MED ORDER — DIPHENHYDRAMINE HCL 25 MG PO TABS
50.0000 mg | ORAL_TABLET | ORAL | Status: DC
  Administered 2012-05-31: 50 mg via ORAL

## 2012-05-31 NOTE — Discharge Instructions (Signed)
Infliximab injection What is this medicine? INFLIXIMAB (in [place] i mab) is used to treat Crohn's disease and ulcerative colitis. It is also used to treat ankylosing spondylitis, psoriasis, and some forms of arthritis. This medicine may be used for other purposes; ask your health care provider or pharmacist if you have questions. What should I tell my health care provider before I take this medicine? They need to know if you have any of these conditions: -diabetes -exposure to tuberculosis -heart failure -hepatitis or liver disease -immune system problems -infection -lung or breathing disease, like COPD -multiple sclerosis -current or past resident of [place] or [place] -seizure disorder -an unusual or allergic reaction to infliximab, mouse proteins, other medicines, foods, dyes, or preservatives -pregnant or trying to get pregnant -breast-feeding How should I use this medicine? This medicine is for injection into a vein. It is usually given by a health care professional in a hospital or clinic setting. A special MedGuide will be given to you by the pharmacist with each prescription and refill. Be sure to read this information carefully each time. Talk to your pediatrician regarding the use of this medicine in children. Special care may be needed. Overdosage: If you think you have taken too much of this medicine contact a poison control center or emergency room at once. NOTE: This medicine is only for you. Do not share this medicine with others. What if I miss a dose? It is important not to miss your dose. Call your doctor or health care professional if you are unable to keep an appointment. What may interact with this medicine? Do not take this medicine with any of the following medications: -anakinra -rilonacept This medicine may also interact with the following medications: -vaccines This list may not describe all possible interactions. Give your health care provider  a list of all the medicines, herbs, non-prescription drugs, or dietary supplements you use. Also tell them if you smoke, drink alcohol, or use illegal drugs. Some items may interact with your medicine. What should I watch for while using this medicine? Visit your doctor or health care professional for regular checks on your progress. If you get a cold or other infection while receiving this medicine, call your doctor or health care professional. Do not treat yourself. This medicine may decrease your body's ability to fight infections. Before beginning therapy, your doctor may do a test to see if you have been exposed to tuberculosis. This medicine may make the symptoms of heart failure worse in some patients. If you notice symptoms such as increased shortness of breath or swelling of the ankles or legs, contact your health care provider right away. If you are going to have surgery or dental work, tell your health care professional or dentist that you have received this medicine. If you take this medicine for plaque psoriasis, stay out of the sun. If you cannot avoid being in the sun, wear protective clothing and use sunscreen. Do not use sun lamps or tanning beds/booths. What side effects may I notice from receiving this medicine? Side effects that you should report to your doctor or health care professional as soon as possible: -allergic reactions like skin rash, itching or hives, swelling of the face, lips, or tongue -chest pain -fever or chills, usually related to the infusion -muscle or joint pain -red, scaly patches or raised bumps on the skin -signs of infection - fever or chills, cough, sore throat, pain or difficulty passing urine -swollen lymph nodes in the neck, underarm,  or groin areas -unexplained weight loss -unusual bleeding or bruising -unusually weak or tired -yellowing of the eyes or skin Side effects that usually do not require medical attention (report to your doctor or health  care professional if they continue or are bothersome): -headache -heartburn or stomach pain -nausea, vomiting This list may not describe all possible side effects. Call your doctor for medical advice about side effects. You may report side effects to FDA at 1-800-FDA-1088. Where should I keep my medicine? This drug is given in a hospital or clinic and will not be stored at home. NOTE: This sheet is a summary. It may not cover all possible information. If you have questions about this medicine, talk to your doctor, pharmacist, or health care provider.  2012, Elsevier/Gold Standard. (07/29/2008 10:26:02 AM)

## 2012-07-08 ENCOUNTER — Telehealth: Payer: Self-pay | Admitting: Internal Medicine

## 2012-07-10 ENCOUNTER — Other Ambulatory Visit: Payer: Self-pay

## 2012-07-10 MED ORDER — ZOLPIDEM TARTRATE 10 MG PO TABS: 10.0000 mg | ORAL_TABLET | Freq: Every evening | ORAL | Status: DC | PRN

## 2012-07-10 NOTE — Telephone Encounter (Signed)
Refilled rx for Ambien per Dr. [name]

## 2012-07-10 NOTE — Telephone Encounter (Signed)
Told pt I would leave his rx for Ambien up front to be picked up; Patient agreed

## 2012-07-11 ENCOUNTER — Telehealth: Payer: Self-pay | Admitting: Internal Medicine

## 2012-07-12 ENCOUNTER — Telehealth: Payer: Self-pay | Admitting: Internal Medicine

## 2012-07-12 ENCOUNTER — Encounter (HOSPITAL_COMMUNITY): Admission: RE | Admit: 2012-07-12 | Payer: Medicare Other | Source: Ambulatory Visit

## 2012-07-15 NOTE — Telephone Encounter (Signed)
Left message to call back  

## 2012-07-15 NOTE — Telephone Encounter (Signed)
Pts mother states that [name] and [name] was missing a form with [name] signature for his Remicade. She faxed [employer] the form today so application should be complete. Pt has scheduled his next Remicade for August 1st.

## 2012-07-16 NOTE — Telephone Encounter (Signed)
Spoke with pts mother yesterday and his Remicade infusion was rescheduled due to some paperwork not being signed and sent to [employer].

## 2012-07-22 ENCOUNTER — Telehealth: Payer: Self-pay | Admitting: *Deleted

## 2012-07-22 NOTE — Telephone Encounter (Signed)
Pts mother states Matthew [name] is scheduled for Remicade 8/1 and his Remicade is supposed to be delivered 8/1. In computer pt is scheduled for 07/26/12. Mother states he cannot come that day. I spoke with Jonna Clark in short stay and changed his appt to 07/25/12@11am . Spoke with [employer] and rescheduled the Remicade delivery for 07/24/12. Left message for Otila Kluver in [place] and spoke with pts mother, she is aware.

## 2012-07-25 ENCOUNTER — Encounter (HOSPITAL_COMMUNITY)
Admission: RE | Admit: 2012-07-25 | Discharge: 2012-07-25 | Disposition: A | Discharge status: Home / Self Care | Payer: Medicare Other | Source: Ambulatory Visit | Attending: Internal Medicine | Admitting: Internal Medicine

## 2012-07-25 VITALS — BP 137/84 | HR 94 | Temp 97.8°F | Resp 20 | Wt 271.4 lb

## 2012-07-25 DIAGNOSIS — K5190 Ulcerative colitis, unspecified, without complications: Secondary | ICD-10-CM | POA: Insufficient documentation

## 2012-07-25 MED ORDER — ACETAMINOPHEN 325 MG PO TABS
650.0000 mg | ORAL_TABLET | ORAL | Status: DC
  Administered 2012-07-25: 650 mg via ORAL
  Filled 2012-07-25: qty 2

## 2012-07-25 MED ORDER — SODIUM CHLORIDE 0.9 % IV SOLN
INTRAVENOUS | Status: DC
  Administered 2012-07-25: 12:00:00 via INTRAVENOUS

## 2012-07-25 MED ORDER — SODIUM CHLORIDE 0.9 % IV SOLN
5.0000 mg/kg | INTRAVENOUS | Status: DC
  Administered 2012-07-25: 600 mg via INTRAVENOUS
  Filled 2012-07-25: qty 60

## 2012-07-25 MED ORDER — DIPHENHYDRAMINE HCL 25 MG PO TABS
50.0000 mg | ORAL_TABLET | ORAL | Status: DC
  Administered 2012-07-25: 50 mg via ORAL
  Filled 2012-07-25: qty 2

## 2012-07-26 ENCOUNTER — Encounter (HOSPITAL_COMMUNITY): Payer: Self-pay

## 2012-08-08 NOTE — Telephone Encounter (Signed)
See previous note

## 2012-08-14 ENCOUNTER — Ambulatory Visit (INDEPENDENT_AMBULATORY_CARE_PROVIDER_SITE_OTHER): Payer: Self-pay | Admitting: Internal Medicine

## 2012-08-14 DIAGNOSIS — K5190 Ulcerative colitis, unspecified, without complications: Secondary | ICD-10-CM

## 2012-08-14 MED ORDER — TUBERCULIN PPD 5 UNIT/0.1ML ID SOLN: 5.0000 [IU] | Freq: Once | INTRADERMAL | Status: DC

## 2012-08-16 ENCOUNTER — Other Ambulatory Visit (INDEPENDENT_AMBULATORY_CARE_PROVIDER_SITE_OTHER): Payer: Self-pay

## 2012-08-16 ENCOUNTER — Other Ambulatory Visit: Payer: Self-pay

## 2012-08-16 DIAGNOSIS — K5190 Ulcerative colitis, unspecified, without complications: Secondary | ICD-10-CM

## 2012-08-16 LAB — TB SKIN TEST: Induration: 0 mm

## 2012-08-16 MED ORDER — TUBERCULIN PPD 5 UNIT/0.1ML ID SOLN: 5.0000 [IU] | Freq: Once | INTRADERMAL | Status: DC

## 2012-08-23 ENCOUNTER — Encounter (HOSPITAL_COMMUNITY): Payer: Self-pay

## 2012-09-02 ENCOUNTER — Telehealth: Payer: Self-pay | Admitting: Internal Medicine

## 2012-09-03 ENCOUNTER — Telehealth: Payer: Self-pay | Admitting: Internal Medicine

## 2012-09-03 ENCOUNTER — Encounter (HOSPITAL_COMMUNITY): Payer: Self-pay

## 2012-09-03 ENCOUNTER — Emergency Department (HOSPITAL_COMMUNITY)
Admission: EM | Admit: 2012-09-03 | Discharge: 2012-09-03 | Disposition: A | Discharge status: Home / Self Care | Payer: Medicare Other | Attending: Emergency Medicine | Admitting: Emergency Medicine

## 2012-09-03 ENCOUNTER — Emergency Department (HOSPITAL_COMMUNITY): Payer: Medicare Other

## 2012-09-03 DIAGNOSIS — Z79899 Other long term (current) drug therapy: Secondary | ICD-10-CM | POA: Insufficient documentation

## 2012-09-03 DIAGNOSIS — R0602 Shortness of breath: Secondary | ICD-10-CM | POA: Insufficient documentation

## 2012-09-03 DIAGNOSIS — R509 Fever, unspecified: Secondary | ICD-10-CM

## 2012-09-03 LAB — COMPREHENSIVE METABOLIC PANEL
ALT: 16 U/L (ref 0–53)
AST: 23 U/L (ref 0–37)
Albumin: 3.1 g/dL — ABNORMAL LOW (ref 3.5–5.2)
Calcium: 8.4 mg/dL (ref 8.4–10.5)
Creatinine, Ser: 0.9 mg/dL (ref 0.50–1.35)
GFR calc non Af Amer: >90 mL/min (ref >90)
Sodium: 137 mEq/L (ref 135–145)
Total Protein: 6.8 g/dL (ref 6.0–8.3)

## 2012-09-03 LAB — CBC WITH DIFFERENTIAL/PLATELET
Eosinophils Absolute: 0 10*3/uL (ref 0.0–0.7)
HCT: 37.8 % — ABNORMAL LOW (ref 39.0–52.0)
Hemoglobin: 13 g/dL (ref 13.0–17.0)
Lymphs Abs: 0.7 10*3/uL (ref 0.7–4.0)
MCH: 29.5 pg (ref 26.0–34.0)
MCHC: 34.4 g/dL (ref 30.0–36.0)
MCV: 85.7 fL (ref 78.0–100.0)
Monocytes Absolute: 0.3 10*3/uL (ref 0.1–1.0)
Monocytes Relative: 5 % (ref 3–12)
Neutrophils Relative %: 82 % — ABNORMAL HIGH (ref 43–77)
RBC: 4.41 MIL/uL (ref 4.22–5.81)

## 2012-09-03 LAB — URINALYSIS, ROUTINE W REFLEX MICROSCOPIC
Bilirubin Urine: NEGATIVE
Glucose, UA: NEGATIVE mg/dL
Protein, ur: 30 mg/dL — AB

## 2012-09-03 MED ORDER — IBUPROFEN 200 MG PO TABS
600.0000 mg | ORAL_TABLET | Freq: Once | ORAL | Status: AC
  Administered 2012-09-03: 600 mg via ORAL
  Filled 2012-09-03: qty 3

## 2012-09-03 MED ORDER — ACETAMINOPHEN 325 MG PO TABS
975.0000 mg | ORAL_TABLET | Freq: Once | ORAL | Status: AC
  Administered 2012-09-03: 975 mg via ORAL
  Filled 2012-09-03: qty 3

## 2012-09-03 MED ORDER — SODIUM CHLORIDE 0.9 % IV SOLN
INTRAVENOUS | Status: DC
  Administered 2012-09-03 (×2): via INTRAVENOUS

## 2012-09-03 MED ORDER — SODIUM CHLORIDE 0.9 % IV BOLUS (SEPSIS)
500.0000 mL | Freq: Once | INTRAVENOUS | Status: AC
  Administered 2012-09-03: 500 mL via INTRAVENOUS

## 2012-09-03 MED ORDER — SODIUM CHLORIDE 0.9 % IV BOLUS (SEPSIS)
1000.0000 mL | Freq: Once | INTRAVENOUS | Status: AC
  Administered 2012-09-03: 1000 mL via INTRAVENOUS

## 2012-09-03 NOTE — ED Notes (Signed)
Pt presents with shortness of breath- hyperventilating at present- encouraged to take deep breaths.  02 sats 100 RA.  RR24.- Pt reports cold like symptoms x 1 day  -Nausea without vomiting x 1 day.  Pt c/o of urinary retention.

## 2012-09-03 NOTE — Telephone Encounter (Signed)
Spoke with pts mother and she states she will take him to [facility] ER to be evaluated.

## 2012-09-03 NOTE — ED Provider Notes (Cosign Needed)
History     CSN: [id number]  Arrival date & time 09/03/12  1239   First MD Initiated Contact with Patient 09/03/12 1306      Chief Complaint  Patient presents with  . Fever    x 1 day oral 102  . Shortness of Breath    x 1 day    (Consider location/radiation/quality/duration/timing/severity/associated sxs/prior treatment) HPI  Patient presents emergency Department with his mother. They state last night he started having fever and chills up to 102.4 this morning. He denies cough, sore throat, rhinorrhea, vomiting, diarrhea,. He does complain of some nausea and he states he had some central chest pain described as aching and slightly per pleuritic last night. He also has some mild diffuse upper abdominal pain last night that is dull. He also describes dysuria, being very thirsty but without polyuria. Patient has a history of Crohn's or ulcerative colitis. He states he has a flare up 4-5 times a year. He states he's had these symptoms before but he does not know what was wrong with him at the time.  PCP Dr. Candie Mile Gastroenterologist Dr. [name]  Past Medical History  Diagnosis Date  . Ulcerative colitis, unspecified   . Glaucoma   . Primary generalized (osteo)arthritis   . Inflammatory bowel disease   . Colon polyps     2007    Past Surgical History  Procedure Date  . Ventral hernia repair   . Colon resection   . Eye surgery   . Cornea transplant both eyes     Family History  Problem Relation Age of Onset  . Colon cancer Maternal Grandfather   . Liver cancer Maternal Grandfather   . Colitis Father   . Colon polyps Father   . Colon polyps Mother   . Heart disease Father   . Heart disease Paternal Grandmother   . Breast cancer Cousin   . Prostate cancer Cousin     History  Substance Use Topics  . Smoking status: Never Smoker   . Smokeless tobacco: Never Used  . Alcohol Use: No   Lives with mother   Review of Systems  All other systems reviewed and are  negative.    Allergies  Contrast media and Dimetapp allergy sinus  Home Medications   Current Outpatient Rx  Name Route Sig Dispense Refill  . BRIMONIDINE TARTRATE 0.1 % OP SOLN Left Eye Place 1 drop into the left eye 2 (two) times daily.      . DORZOLAMIDE HCL-TIMOLOL MAL 22.3-6.8 MG/ML OP SOLN Left Eye Place 1 drop into the left eye 2 (two) times daily.      Marland Kitchen DOXYCYCLINE MONOHYDRATE 100 MG PO CAPS Oral Take 100 mg by mouth 1 day or 1 dose.      . INFLIXIMAB 100 MG IV SOLR  Diagnosis:  Ulcerative Colitis(ICD-556.9) Weight 263     Dosing Instructions:  41m/kg every 6 weeks Pre Medication:  Benadryl 25-589mby mouth, Tylenol 65024my mouth prior to treatment Date of Last TB skin test 08/02/10   Results:  0mm9meach 3  . LATANOPROST 0.005 % OP SOLN Left Eye Place 1 drop into the left eye at bedtime.    . MEMarland KitchenALAMINE ER 500 MG PO CPCR Oral Take 2,000 mg by mouth 2 (two) times daily.      . MEMarland KitchenALAMINE 4 G RE ENEM  USE ONE ENEMA RECTALLY ONE TIME DAILY 1680 mL 10  . PILOCARPINE HCL 1 % OP SOLN Left Eye Place 1  drop into the left eye 4 (four) times daily.      Marland Kitchen ZOLPIDEM TARTRATE 10 MG PO TABS Oral Take 1 tablet (10 mg total) by mouth at bedtime as needed. 30 tablet 2    BP 122/55  Pulse 120  Temp 104 F (40 C) (Rectal)  Resp 34  Ht 5' 11"  (1.803 m)  Wt 265 lb (120.203 kg)  BMI 36.96 kg/m2  SpO2 93%  Vital signs normal except for tachycardia and fever   Physical Exam  Nursing note and vitals reviewed. Constitutional: He is oriented to person, place, and time. He appears well-developed and well-nourished.  Non-toxic appearance. He does not appear ill. No distress.  HENT:  Head: Normocephalic and atraumatic.  Right Ear: External ear normal.  Left Ear: External ear normal.  Nose: Nose normal. No mucosal edema or rhinorrhea.  Mouth/Throat: Mucous membranes are normal. No dental abscesses or uvula swelling.       Mouth dry  Eyes: Conjunctivae are normal.       Patient's eyes were  disconjugate with the left eye drifting laterally. His left pupil is dilated compared to the left and minimally reactive. He states he had an eye injury in 2006 and has minimal vision in that eye  Neck: Normal range of motion and full passive range of motion without pain. Neck supple.  Cardiovascular: Normal rate, regular rhythm and normal heart sounds.  Exam reveals no gallop and no friction rub.   No murmur heard. Pulmonary/Chest: Effort normal and breath sounds normal. No respiratory distress. He has no wheezes. He has no rhonchi. He has no rales. He exhibits no tenderness and no crepitus.  Abdominal: Soft. Normal appearance and bowel sounds are normal. He exhibits no distension. There is no tenderness. There is no rebound and no guarding.  Musculoskeletal: Normal range of motion. He exhibits no edema and no tenderness.       Moves all extremities well.   Neurological: He is alert and oriented to person, place, and time. He has normal strength. No cranial nerve deficit.  Skin: Skin is warm, dry and intact. No rash noted. No erythema. No pallor.  Psychiatric: He has a normal mood and affect. His speech is normal and behavior is normal. His mood appears not anxious.    ED Course  Procedures (including critical care time)  Medications  0.9 %  sodium chloride infusion (not administered)  sodium chloride 0.9 % bolus 1,000 mL (1000 mL Intravenous Given 09/03/12 1513)  acetaminophen (TYLENOL) tablet 975 mg (975 mg Oral Given 09/03/12 1530)  ibuprofen (ADVIL,MOTRIN) tablet 600 mg (600 mg Oral Given 09/03/12 1530)     Patient had rectal temperature of 104. His fever was treated. He was given IV fluids. At change of shift I am waiting for his labs and urinalysis to be done. At this point the source of his fever is unknown.  16:06 turned over to Dr Winfred Leeds   Dg Chest 2 View  09/03/2012  *RADIOLOGY REPORT*  Clinical Data:  chest pain, fever, shortness of breath  CHEST - 2 VIEW  Comparison: None.   Findings: Normal heart size.  Slight vascular and interstitial prominence.  No definite CHF pattern, pneumonia, collapse, consolidation, effusion, or pneumothorax.  Trachea midline. Degenerative changes of the spine.  IMPRESSION: Nonspecific vascular and interstitial prominence.  No definite CHF or pneumonia.   Original Report Authenticated By: Jerilynn Mages. Daryll Brod, M.D.      1. Fever    Disposition per Dr Winfred Leeds  MDM          Janice Norrie, MD 09/03/12 [phone]

## 2012-09-03 NOTE — ED Notes (Signed)
Pt denies CP at present

## 2012-09-03 NOTE — ED Notes (Signed)
MDY:[id number]<HV> Expected date:<BR> Expected time:<BR> Means of arrival:<BR> Comments:<BR> Hold for triage. sob

## 2012-09-03 NOTE — ED Provider Notes (Addendum)
Seen by me 5:10 PM patient complained of shortness of breath and hyperventilating onset yesterday and felt febrile, clammy onset yesterday. No abdominal pain no nausea no vomiting. Patient reported difficulty urinating yesterday and felt difficulty emptying his bladder he presently feels like he is able to empty his bladder feels much improved since treatment in the emergency department with intravenous fluids he is also been treated with Tylenol. On exam alert nontoxic lungs clear auscultation heart regular rate and rhythm abdomen nondistended nontender  Date: 09/03/2012  Rate: 120  Rhythm: sinus tachycardia  QRS Axis: normal  Intervals: normal  ST/T Wave abnormalities: normal  Conduction Disutrbances:none  Narrative Interpretation:   Old EKG Reviewed: none available 9:15 PM patient is asymptomatic. Resting comfortably well-appearing. Medical decision making. Patient last had Remicade which is potentially immunocompromising 6 weeks ago. Plan he is to followup and keep his scheduled appointment with Dr. [name] in 2 days Results for orders placed during the hospital encounter of 09/03/12  COMPREHENSIVE METABOLIC PANEL      Component Value Range   Sodium 137  135 - 145 mEq/L   Potassium 3.2 (*) 3.5 - 5.1 mEq/L   Chloride 106  96 - 112 mEq/L   CO2 22  19 - 32 mEq/L   Glucose, Bld 129 (*) 70 - 99 mg/dL   BUN 15  6 - 23 mg/dL   Creatinine, Ser 0.90  0.50 - 1.35 mg/dL   Calcium 8.4  8.4 - 10.5 mg/dL   Total Protein 6.8  6.0 - 8.3 g/dL   Albumin 3.1 (*) 3.5 - 5.2 g/dL   AST 23  0 - 37 U/L   ALT 16  0 - 53 U/L   Alkaline Phosphatase 54  39 - 117 U/L   Total Bilirubin 1.4 (*) 0.3 - 1.2 mg/dL   GFR calc non Af Amer >90  >90 mL/min   GFR calc Af Amer >90  >90 mL/min  CBC WITH DIFFERENTIAL      Component Value Range   WBC 5.5  4.0 - 10.5 K/uL   RBC 4.41  4.22 - 5.81 MIL/uL   Hemoglobin 13.0  13.0 - 17.0 g/dL   HCT 37.8 (*) 39.0 - 52.0 %   MCV 85.7  78.0 - 100.0 fL   MCH 29.5  26.0 - 34.0 pg    MCHC 34.4  30.0 - 36.0 g/dL   RDW 12.7  11.5 - 15.5 %   Platelets 134 (*) 150 - 400 K/uL   Neutrophils Relative 82 (*) 43 - 77 %   Neutro Abs 4.5  1.7 - 7.7 K/uL   Lymphocytes Relative 12  12 - 46 %   Lymphs Abs 0.7  0.7 - 4.0 K/uL   Monocytes Relative 5  3 - 12 %   Monocytes Absolute 0.3  0.1 - 1.0 K/uL   Eosinophils Relative 0  0 - 5 %   Eosinophils Absolute 0.0  0.0 - 0.7 K/uL   Basophils Relative 0  0 - 1 %   Basophils Absolute 0.0  0.0 - 0.1 K/uL  URINALYSIS, ROUTINE W REFLEX MICROSCOPIC      Component Value Range   Color, Urine AMBER (*) YELLOW   APPearance CLEAR  CLEAR   Specific Gravity, Urine 1.038 (*) 1.005 - 1.030   pH 5.5  5.0 - 8.0   Glucose, UA NEGATIVE  NEGATIVE mg/dL   Hgb urine dipstick SMALL (*) NEGATIVE   Bilirubin Urine NEGATIVE  NEGATIVE   Ketones, ur TRACE (*) NEGATIVE  mg/dL   Protein, ur 30 (*) NEGATIVE mg/dL   Urobilinogen, UA 0.2  0.0 - 1.0 mg/dL   Nitrite NEGATIVE  NEGATIVE   Leukocytes, UA NEGATIVE  NEGATIVE  URINE MICROSCOPIC-ADD ON      Component Value Range   RBC / HPF 7-10  <3 RBC/hpf   Urine-Other MUCOUS PRESENT    LACTIC ACID, PLASMA      Component Value Range   Lactic Acid, Venous 0.9  0.5 - 2.2 mmol/L   Dg Chest 2 View  09/03/2012  *RADIOLOGY REPORT*  Clinical Data:  chest pain, fever, shortness of breath  CHEST - 2 VIEW  Comparison: None.  Findings: Normal heart size.  Slight vascular and interstitial prominence.  No definite CHF pattern, pneumonia, collapse, consolidation, effusion, or pneumothorax.  Trachea midline. Degenerative changes of the spine.  IMPRESSION: Nonspecific vascular and interstitial prominence.  No definite CHF or pneumonia.   Original Report Authenticated By: Jerilynn Mages. Daryll Brod, M.D.     Orlie Dakin, MD 09/03/12 2118  Orlie Dakin, MD 09/03/12 2118

## 2012-09-03 NOTE — Telephone Encounter (Signed)
Pt with UC, scheduled for Remicade at [facility] on Thursday. Last night pt reports he started running a fever of 102.2.When he tried to eat something last night his stomach hurt a little bit.   No cough, sore throat, or other signs of infection. Pt has had no nausea or diarrhea, states he has been constipated. He states his temp this morning is 102.4. Pt has not eaten anything yet this morning. Dr. [name] please advise.

## 2012-09-03 NOTE — Telephone Encounter (Signed)
The patient is immunosuppressed. He needs to go to ER ASAP for a comprehensive assessment of a temp that high

## 2012-09-04 ENCOUNTER — Other Ambulatory Visit: Payer: Self-pay | Admitting: Internal Medicine

## 2012-09-04 ENCOUNTER — Telehealth: Payer: Self-pay

## 2012-09-04 DIAGNOSIS — R509 Fever, unspecified: Secondary | ICD-10-CM

## 2012-09-04 LAB — URINE CULTURE

## 2012-09-04 MED ORDER — PREDNISONE 50 MG PO TABS: ORAL_TABLET | ORAL | Status: DC

## 2012-09-04 MED ORDER — DIPHENHYDRAMINE HCL 25 MG PO TABS: ORAL_TABLET | ORAL | Status: AC

## 2012-09-04 NOTE — Telephone Encounter (Signed)
Pt scheduled for CT of A/P at [place] CT 09/05/12 arrival time 10:15am for a 10:30am appt. Pt to be NPO 4 hours prior to CT. Pt to drink 1 bottle of contrast at 8:30am, 2nd bottle at 9:30am. Premeds called into pharmacy. Pts mother aware of all instructions. CT ordered per Dr. [name] due to pts fever, H/O inflammatory bowel, R/O abscess.

## 2012-09-04 NOTE — Telephone Encounter (Signed)
Message copied by Algernon Huxley on Wed Sep 04, 2012 10:33 AM ------      Message from: Irene Shipper      Created: Wed Sep 04, 2012  9:34 AM      Regarding: appointment this week       Danthony, Kendrix was seen in the ER regarding fever. It appears that the workup was negative. Have him Amy on Friday afternoon for followup. I am supervising. Call him. Thanks

## 2012-09-04 NOTE — Telephone Encounter (Signed)
Pt having CT scan 09/05/12. Pt scheduled to see Nicoletta Ba PA 09/06/12@1 :30pm.

## 2012-09-05 ENCOUNTER — Ambulatory Visit (INDEPENDENT_AMBULATORY_CARE_PROVIDER_SITE_OTHER)
Admission: RE | Admit: 2012-09-05 | Discharge: 2012-09-05 | Disposition: A | Discharge status: Home / Self Care | Payer: Medicare Other | Source: Ambulatory Visit | Attending: Internal Medicine | Admitting: Internal Medicine

## 2012-09-05 ENCOUNTER — Inpatient Hospital Stay (HOSPITAL_COMMUNITY): Admission: RE | Admit: 2012-09-05 | Payer: Self-pay | Source: Ambulatory Visit

## 2012-09-05 DIAGNOSIS — R509 Fever, unspecified: Secondary | ICD-10-CM

## 2012-09-05 MED ORDER — IOHEXOL 300 MG/ML  SOLN
100.0000 mL | Freq: Once | INTRAMUSCULAR | Status: AC | PRN
  Administered 2012-09-05: 100 mL via INTRAVENOUS

## 2012-09-06 ENCOUNTER — Ambulatory Visit (INDEPENDENT_AMBULATORY_CARE_PROVIDER_SITE_OTHER)
Admission: RE | Admit: 2012-09-06 | Discharge: 2012-09-06 | Disposition: A | Discharge status: Home / Self Care | Payer: Medicare Other | Source: Ambulatory Visit | Attending: Physician Assistant | Admitting: Physician Assistant

## 2012-09-06 ENCOUNTER — Ambulatory Visit (INDEPENDENT_AMBULATORY_CARE_PROVIDER_SITE_OTHER): Payer: Medicare Other | Admitting: Physician Assistant

## 2012-09-06 ENCOUNTER — Other Ambulatory Visit: Payer: Self-pay | Admitting: Physician Assistant

## 2012-09-06 ENCOUNTER — Other Ambulatory Visit (INDEPENDENT_AMBULATORY_CARE_PROVIDER_SITE_OTHER): Payer: Medicare Other

## 2012-09-06 ENCOUNTER — Encounter: Payer: Self-pay | Admitting: Physician Assistant

## 2012-09-06 VITALS — BP 132/80 | HR 100 | Temp 100.4°F | Ht 70.5 in | Wt 270.4 lb

## 2012-09-06 DIAGNOSIS — R509 Fever, unspecified: Secondary | ICD-10-CM

## 2012-09-06 DIAGNOSIS — R109 Unspecified abdominal pain: Secondary | ICD-10-CM

## 2012-09-06 DIAGNOSIS — R05 Cough: Secondary | ICD-10-CM

## 2012-09-06 DIAGNOSIS — H409 Unspecified glaucoma: Secondary | ICD-10-CM | POA: Insufficient documentation

## 2012-09-06 LAB — CBC WITH DIFFERENTIAL/PLATELET
Basophils Absolute: 0 10*3/uL (ref 0.0–0.1)
Eosinophils Absolute: 0 10*3/uL (ref 0.0–0.7)
HCT: 40.9 % (ref 39.0–52.0)
Lymphs Abs: 1.2 10*3/uL (ref 0.7–4.0)
MCV: 87.9 fl (ref 78.0–100.0)
Monocytes Absolute: 1.3 10*3/uL — ABNORMAL HIGH (ref 0.1–1.0)
Platelets: 158 10*3/uL (ref 150.0–400.0)
RDW: 13.2 % (ref 11.5–14.6)

## 2012-09-06 LAB — COMPREHENSIVE METABOLIC PANEL
ALT: 31 U/L (ref 0–53)
Alkaline Phosphatase: 60 U/L (ref 39–117)
Sodium: 136 mEq/L (ref 135–145)
Total Bilirubin: 1 mg/dL (ref 0.3–1.2)
Total Protein: 7.4 g/dL (ref 6.0–8.3)

## 2012-09-06 MED ORDER — ONDANSETRON HCL 4 MG PO TABS: 4.0000 mg | ORAL_TABLET | Freq: Four times a day (QID) | ORAL | Status: DC | PRN

## 2012-09-06 MED ORDER — DOXYCYCLINE HYCLATE 100 MG PO CAPS: 100.0000 mg | ORAL_CAPSULE | Freq: Two times a day (BID) | ORAL | Status: AC

## 2012-09-06 MED ORDER — METRONIDAZOLE 250 MG PO TABS: 250.0000 mg | ORAL_TABLET | Freq: Four times a day (QID) | ORAL | Status: AC

## 2012-09-06 NOTE — Addendum Note (Signed)
Addended by: Oda Kilts on: 09/06/2012 04:06 PM   Modules accepted: Orders

## 2012-09-06 NOTE — Progress Notes (Signed)
Subjective:    Patient ID: Matthew [name], male    DOB: 1966/04/02, 46 y.o.   MRN: [id number]  HPI  Matthew [name] is a pleasant 46 year old white male well-known to Dr. [name] who has history of severe ulcerative colitis. He had undergone a remote subtotal colectomy as a residual 40 cm of the left colon and rectum remaining. His surgery was done at [facility] and at the time of the surgery there was concern about Crohn's/indeterminate colitis. He cannot tolerate immunomodulators and has been on mesalamine and Remicade 5 mg per kilogram every 6 weeks over the past couple of years His last colonoscopy was done in February of 2012 there was mild active colitis noted with no change in meds. Biopsies are consistent with ulcerative colitis.  Patient comes in today with complaint of fever and chills over the past several days. Says his current symptoms started on Sunday, 09/02/2012 with fever all of by clamminess and diaphoresis. He says his fever and chills have persisted all week, generally having fevers 202 but had attempt to 104 on Wednesday this week and was directed to the emergency room He says his fevers are associated with shaking chills. He has had some headache but denies in T. arthralgias or myalgias. He has not had any rash. He denies any sore throat stiff neck or sinus congestion. He has had a dry cough though somewhat winded or short of breath breath with exertion. He states that he has had pneumonia for times previously. He has also been nauseated over the past few days and has been having a difficult time eating he has not had any vomiting. His bowel movements have been stable for him with 7 or 8 formed stools per day but now over the past 48 hours she is also having diarrhea but still about 8 times per day. He has not been on any new medications no recent antibiotics and no known exposures. He says he feels fatigued and generally "bad". He was due for Remicade infusion yesterday which was held. Labs on 911  show WBC of 5.5 hemoglobin 13 hematocrit of 37.8 lactic acid 0.9 potassium 3.2 glucose 129 liver function studies normal is positive for culture multiple species 15,000 colonies consistent with contamination. Patient also had CT scan of the abdomen and pelvis done which showed no bowel wall thickening or dilation or other abnormality in the abdomen he does have a small ventral hernia containing  small bowel.    Review of Systems  Constitutional: Positive for fever, chills, diaphoresis, activity change, appetite change and fatigue.  Eyes: Negative.   Respiratory: Positive for cough and shortness of breath.   Cardiovascular: Negative.   Gastrointestinal: Positive for nausea, abdominal pain and diarrhea.  Genitourinary: Negative.   Musculoskeletal: Negative.   Neurological: Positive for headaches.  Hematological: Negative.   Psychiatric/Behavioral: Negative.    Outpatient Encounter Prescriptions as of 09/06/2012  Medication Sig Dispense Refill  . brimonidine (ALPHAGAN P) 0.1 % SOLN Place 1 drop into the left eye 2 (two) times daily.        . diphenhydrAMINE (BENADRYL) 25 MG tablet Take 2 tabs at 9:30am 09/05/12  2 tablet  0  . dorzolamide-timolol (COSOPT) 22.3-6.8 MG/ML ophthalmic solution Place 1 drop into the left eye 2 (two) times daily.        Marland Kitchen doxycycline (MONODOX) 100 MG capsule Take 100 mg by mouth 1 day or 1 dose.        . latanoprost (XALATAN) 0.005 % ophthalmic solution Place 1 drop  into the left eye at bedtime.      . mesalamine (PENTASA) 500 MG CR capsule Take 2,000 mg by mouth 2 (two) times daily.        . mesalamine (ROWASA) 4 G enema USE ONE ENEMA RECTALLY ONE TIME DAILY  1680 mL  10  . pilocarpine (PILOCAR) 1 % ophthalmic solution Place 1 drop into the left eye 4 (four) times daily.        Marland Kitchen zolpidem (AMBIEN) 10 MG tablet Take 1 tablet (10 mg total) by mouth at bedtime as needed.  30 tablet  2  . doxycycline (VIBRAMYCIN) 100 MG capsule Take 1 capsule (100 mg total) by mouth 2  (two) times daily.  20 capsule  0  . inFLIXimab (REMICADE) 100 MG injection Diagnosis:  Ulcerative Colitis(ICD-556.9) Weight 263     Dosing Instructions:  18m/kg every 6 weeks Pre Medication:  Benadryl 25-547mby mouth, Tylenol 65075my mouth prior to treatment Date of Last TB skin test 08/02/10   Results:  0mm26m each  3  . metroNIDAZOLE (FLAGYL) 250 MG tablet Take 1 tablet (250 mg total) by mouth 4 (four) times daily.  40 tablet  0  . ondansetron (ZOFRAN) 4 MG tablet Take 1 tablet (4 mg total) by mouth every 6 (six) hours as needed for nausea.  30 tablet  1  . DISCONTD: predniSONE (DELTASONE) 50 MG tablet Take 1 pill at 9:30pm 09/04/12 Take 1 pill at 3:30am 09/06/11 Take 1 pill at 9:30am 09/05/12  3 tablet  0   Facility-Administered Encounter Medications as of 09/06/2012  Medication Dose Route Frequency Provider Last Rate Last Dose  . 0.9 %  sodium chloride infusion   Intravenous Continuous JohnIrene Shipper 20 mL/hr at 11/01/11 1122    . acetaminophen (TYLENOL) tablet 650 mg  650 mg Oral Once JohnIrene Shipper      . diphenhydrAMINE (BENADRYL) capsule 50 mg  50 mg Oral Once JohnIrene Shipper      . DISCONTD: tuberculin injection 5 Units  5 Units Intradermal Once JohnIrene Shipper      . DISCONTD: tuberculin injection 5 Units  5 Units Intradermal Once DaviSable Feil       Allergies  Allergen Reactions  . Contrast Media (Iodinated Diagnostic Agents) Hives  . Dimetapp Allergy Sinus (Brompheniramine-Ppa-Apap) Hives   Patient Active Problem List  Diagnosis  . LACTOSE INTOLERANCE  . GERD  . ULCERATIVE COLITIS-LEFT SIDE  . ULCERATIVE COLITIS  . INFLAMMATORY BOWEL DISEASE  . Glaucoma   History   Social History  . Marital Status: Single    Spouse Name: N/A    Number of Children: N/A  . Years of Education: N/A   Occupational History  . disabled    Social History Main Topics  . Smoking status: Never Smoker   . Smokeless tobacco: Never Used  . Alcohol Use: No  . Drug Use: No  .  Sexually Active: Not on file   Other Topics Concern  . Not on file   Social History Narrative  . No narrative on file       Objective:   Physical Exam well-developed middle-aged white male in no acute distress, pleasant. Somewhat flushed- blood pressure 132/80 pulse 100 temp 100.4. HEENT; nontraumatic normocephalic EOMI PERRLA sclera anicteric tongue slightly dry, lips chapped. Neck; supple no JVD, Cardiovascular; mild tachycardia no murmur or gallop, Pulmonary ;clear bilaterally, Abdomen; soft minimally tender in bilateral lower quadrants no guarding  no rebound no palpable mass or hepatosplenomegaly midline incisional scar, Rectal; not done, Extremities; no clubbing cyanosis or edema skin warm and dry no rash., Psych; mood and affect normal and appropriate.        Assessment & Plan:  #7  46 year old male with history of ulcerative colitis status post subtotal colectomy maintained on Remicade and mesalamine therapy over the past few years now presenting with 6 day history of fevers between 102 to max of 104, associated with shaking chills malaise nausea headaches and dry cough. Also having loose stools over the past 48 hours. Etiology of his acute illness is not clear, this could be viral, rickettsial or bacterial. Case was discussed with Dr. [name], we will try to keep him out of the hospital for now though patient was cautioned should his symptoms worsen in any way over the weekend to present to the emergency room.  We'll check chest x-ray PA and lateral this afternoon Obtain stool for C. difficile stool culture Check CBC with differential,CMET, CMV PCR, Lyme serologies in [facility] spotted fever serologies Rest at  home and push fluids Zofran 4 mg every 6 hours as needed for nausea Start doxycycline 100 mg by mouth twice daily x10 day Start empiric Flagyl 250 mg by mouth 4 times daily x10 day We'll followup in the office on Monday, 09/09/2012

## 2012-09-06 NOTE — Addendum Note (Signed)
Addended by: Oda Kilts on: 09/06/2012 04:16 PM   Modules accepted: Orders

## 2012-09-06 NOTE — Patient Instructions (Addendum)
You will need to go to the basement today for labs You will have a chest xray done today in our basment We are sending in your prescriptions to your pharmacy Your appointment with Amy is scheduled on 09/09/2012 at 2pm

## 2012-09-06 NOTE — Addendum Note (Signed)
Addended by: Oda Kilts on: 09/06/2012 04:18 PM   Modules accepted: Orders

## 2012-09-06 NOTE — Progress Notes (Signed)
Patient personally seen with the physician assistant. I know Matthew [name] well. I have reviewed his hospital workup for fever. Fevers persist. Looks okay. Agree with additional workup and empiric therapies as outlined. We will see him again Monday. If fevers persist without identifiable cause for response to therapies, infectious disease consultation may be sought.

## 2012-09-09 ENCOUNTER — Ambulatory Visit (INDEPENDENT_AMBULATORY_CARE_PROVIDER_SITE_OTHER): Payer: Medicare Other | Admitting: Physician Assistant

## 2012-09-09 ENCOUNTER — Encounter: Payer: Self-pay | Admitting: Physician Assistant

## 2012-09-09 ENCOUNTER — Other Ambulatory Visit: Payer: Self-pay | Admitting: Internal Medicine

## 2012-09-09 ENCOUNTER — Telehealth: Payer: Self-pay

## 2012-09-09 VITALS — BP 130/76 | HR 80 | Temp 98.7°F | Ht 70.5 in | Wt 268.0 lb

## 2012-09-09 DIAGNOSIS — R509 Fever, unspecified: Secondary | ICD-10-CM

## 2012-09-09 DIAGNOSIS — K5190 Ulcerative colitis, unspecified, without complications: Secondary | ICD-10-CM

## 2012-09-09 LAB — CULTURE, BLOOD (ROUTINE X 2): Culture: NO GROWTH

## 2012-09-09 LAB — ROCKY MTN SPOTTED FVR ABS PNL(IGG+IGM): RMSF IgG: 0.17 IV

## 2012-09-09 NOTE — Telephone Encounter (Signed)
Pt scheduled for Remicade at [facility] short stay 09/20/12@2pm . Beverely Low aware of appt date and time.

## 2012-09-09 NOTE — Progress Notes (Signed)
Subjective:    Patient ID: Matthew [name], male    DOB: Jan 19, 1966, 46 y.o.   MRN: [id number]  HPI Matthew [name] is very nice 46 year old white male known to Dr. [name] was just in the office on Friday, 09/06/2012 with an acute febrile illness. He has history of severe ulcerative colitis is undergone remote subtotal colectomy has residual 40 cm of the left colon and rectum remaining. He has had active disease in the remaining segment. He has been unable to tolerate immunomodulators and has been maintained on Remicade 5 mg per kilogram every 6 weeks over the past couple of years and has done well. Also on mesalamine therapy. He had an acute illness onset 09/02/2012 with fevers as high as 104 at home associated with shaking chills headache dry cough fatigue nausea and diarrhea. He was due for Remicade infusion last week which was held. He had emergency room visit on 09/04/2012 labs at that time unremarkable with the exception of a mild thrombocytopenia. CT scan of the abdomen and pelvis was done which showed no evidence for bowel abnormality. When seen in the office on Friday 9/13 his temp was 100.4 he was mildly tachycardic and ill appearing. Chest x-ray was obtained which was negative . CBC and CMET were also unremarkable. We sent off stool cultures which he has not returned as his diarrhea stopped.  CMV PCR is pending, and Lyme serologies and RMSF serologies.were done. Lyme serologies have returned negative. Today he says he is  much better, his nausea has resolved. He continues to have some frontal headache which he states is not quite as bad as it had been. He did not have any further shaking chills over the weekend and has temp max was 100. His diarrhea stopped as of Saturday and he has been eating and resting well. He is taking Flagyl and doxycycline.   Review of Systems  Constitutional: Positive for fever and fatigue.  Eyes: Negative.   Respiratory: Positive for cough.   Cardiovascular: Negative.    Gastrointestinal: Negative.   Genitourinary: Negative.   Musculoskeletal: Negative.   Neurological: Positive for headaches.  Hematological: Negative.   Psychiatric/Behavioral: Negative.    Outpatient Prescriptions Prior to Visit  Medication Sig Dispense Refill  . brimonidine (ALPHAGAN P) 0.1 % SOLN Place 1 drop into the left eye 2 (two) times daily.        . diphenhydrAMINE (BENADRYL) 25 MG tablet Take 2 tabs at 9:30am 09/05/12  2 tablet  0  . dorzolamide-timolol (COSOPT) 22.3-6.8 MG/ML ophthalmic solution Place 1 drop into the left eye 2 (two) times daily.        Marland Kitchen doxycycline (MONODOX) 100 MG capsule Take 100 mg by mouth 1 day or 1 dose.        Marland Kitchen doxycycline (VIBRAMYCIN) 100 MG capsule Take 1 capsule (100 mg total) by mouth 2 (two) times daily.  20 capsule  0  . inFLIXimab (REMICADE) 100 MG injection Diagnosis:  Ulcerative Colitis(ICD-556.9) Weight 263     Dosing Instructions:  88m/kg every 6 weeks Pre Medication:  Benadryl 25-58mby mouth, Tylenol 65051my mouth prior to treatment Date of Last TB skin test 08/02/10   Results:  0mm72m each  3  . latanoprost (XALATAN) 0.005 % ophthalmic solution Place 1 drop into the left eye at bedtime.      . mesalamine (PENTASA) 500 MG CR capsule Take 2,000 mg by mouth 2 (two) times daily.        . mesalamine (ROWASA)  4 G enema USE ONE ENEMA RECTALLY ONE TIME DAILY  1680 mL  10  . metroNIDAZOLE (FLAGYL) 250 MG tablet Take 1 tablet (250 mg total) by mouth 4 (four) times daily.  40 tablet  0  . ondansetron (ZOFRAN) 4 MG tablet Take 1 tablet (4 mg total) by mouth every 6 (six) hours as needed for nausea.  30 tablet  1  . pilocarpine (PILOCAR) 1 % ophthalmic solution Place 1 drop into the left eye 4 (four) times daily.        Marland Kitchen zolpidem (AMBIEN) 10 MG tablet Take 1 tablet (10 mg total) by mouth at bedtime as needed.  30 tablet  2   Facility-Administered Medications Prior to Visit  Medication Dose Route Frequency Provider Last Rate Last Dose  . 0.9 %   sodium chloride infusion   Intravenous Continuous Irene Shipper, MD 20 mL/hr at 11/01/11 1122    . acetaminophen (TYLENOL) tablet 650 mg  650 mg Oral Once Irene Shipper, MD      . diphenhydrAMINE (BENADRYL) capsule 50 mg  50 mg Oral Once Irene Shipper, MD       Allergies  Allergen Reactions  . Contrast Media (Iodinated Diagnostic Agents) Hives  . Dimetapp Allergy Sinus (Brompheniramine-Ppa-Apap) Hives   Patient Active Problem List  Diagnosis  . LACTOSE INTOLERANCE  . GERD  . ULCERATIVE COLITIS-LEFT SIDE  . ULCERATIVE COLITIS  . INFLAMMATORY BOWEL DISEASE  . Glaucoma   History   Social History  . Marital Status: Single    Spouse Name: N/A    Number of Children: N/A  . Years of Education: N/A   Occupational History  . disabled    Social History Main Topics  . Smoking status: Never Smoker   . Smokeless tobacco: Never Used  . Alcohol Use: No  . Drug Use: No  . Sexually Active: Not on file   Other Topics Concern  . Not on file   Social History Narrative  . No narrative on file       Objective:   Physical Exam well-developed white male in no acute distress, pleasant blood pressure 130/76 pulse 80 temperature 98.7. HEENT; nontraumatic normocephalic EOMI PERRLA sclera anicteric, lips improved no blisters,Neck; Supple no JVD, Cardiovascular; regular rate and rhythm with S1-S2 no murmur or gallop, capillary clear bilaterally, Abdomen ;soft nontender nondistended bowel sounds are active there's no palpable mass or hepatosplenomegaly midline incisional scar, Extremities; no clubbing cyanosis or edema skin warm and dry, Psych ;mood and affect normal and appropriate.        Assessment & Plan:  #34 46 year old male with ulcerative colitis status post subtotal colectomy, maintained on Remicade and mesalamine therapy with acute febrile illness associated with headache malaise shaking chills nausea dry cough and diarrhea. Symptoms much improved over the past 72 hours, etiology is not  clear though suspect viral or rickettsial given the absence of leukocytosis.  Plan; await pending labs Patient is advised to complete a 10 day course of doxycycline and Flagyl He is asked to call should any of his symptoms recur We will reschedule him for Remicade infusion next week, and then plan to get him back on his every 6 weeks schedule.

## 2012-09-09 NOTE — Progress Notes (Signed)
Reviewed and agree with management plan. Pricilla Riffle. Fuller Plan MD Marval Regal

## 2012-09-09 NOTE — Patient Instructions (Addendum)
Dr. Blanch Media nurse will call you regarding your Remicade appointment.  Finish the Flagyl and Doxycycline

## 2012-09-20 ENCOUNTER — Encounter (HOSPITAL_COMMUNITY): Payer: Self-pay

## 2012-09-20 ENCOUNTER — Encounter (HOSPITAL_COMMUNITY)
Admission: RE | Admit: 2012-09-20 | Discharge: 2012-09-20 | Disposition: A | Discharge status: Home / Self Care | Payer: Medicare Other | Source: Ambulatory Visit | Attending: Internal Medicine | Admitting: Internal Medicine

## 2012-09-20 VITALS — BP 137/84 | HR 79 | Temp 98.0°F | Resp 18 | Wt 268.5 lb

## 2012-09-20 DIAGNOSIS — K5190 Ulcerative colitis, unspecified, without complications: Secondary | ICD-10-CM | POA: Insufficient documentation

## 2012-09-20 MED ORDER — ACETAMINOPHEN 325 MG PO TABS
650.0000 mg | ORAL_TABLET | Freq: Once | ORAL | Status: AC
  Administered 2012-09-20: 650 mg via ORAL
  Filled 2012-09-20: qty 2

## 2012-09-20 MED ORDER — SODIUM CHLORIDE 0.9 % IV SOLN
5.0000 mg/kg | Freq: Once | INTRAVENOUS | Status: AC
  Administered 2012-09-20: 600 mg via INTRAVENOUS
  Filled 2012-09-20: qty 60

## 2012-09-20 MED ORDER — DIPHENHYDRAMINE HCL 25 MG PO CAPS
50.0000 mg | ORAL_CAPSULE | Freq: Once | ORAL | Status: AC
  Administered 2012-09-20: 50 mg via ORAL
  Filled 2012-09-20: qty 2

## 2012-09-20 MED ORDER — SODIUM CHLORIDE 0.9 % IV SOLN
INTRAVENOUS | Status: AC
  Administered 2012-09-20: 14:00:00 via INTRAVENOUS

## 2012-09-20 MED ORDER — DIPHENHYDRAMINE HCL 50 MG/ML IJ SOLN: 50.0000 mg | Freq: Once | INTRAMUSCULAR | Status: AC

## 2012-10-15 ENCOUNTER — Other Ambulatory Visit: Payer: Self-pay | Admitting: Internal Medicine

## 2012-10-17 ENCOUNTER — Other Ambulatory Visit: Payer: Self-pay

## 2012-10-17 MED ORDER — ZOLPIDEM TARTRATE 10 MG PO TABS: 10.0000 mg | ORAL_TABLET | Freq: Every evening | ORAL | Status: DC | PRN

## 2012-10-17 NOTE — Telephone Encounter (Signed)
Refilled Ambien per Dr. [name]

## 2012-11-07 ENCOUNTER — Other Ambulatory Visit: Payer: Self-pay | Admitting: Gastroenterology

## 2012-11-07 DIAGNOSIS — K5190 Ulcerative colitis, unspecified, without complications: Secondary | ICD-10-CM

## 2012-11-07 MED ORDER — DIPHENHYDRAMINE HCL 25 MG PO CAPS: 50.0000 mg | ORAL_CAPSULE | Freq: Once | ORAL | Status: DC

## 2012-11-07 MED ORDER — ACETAMINOPHEN 325 MG PO TABS: 650.0000 mg | ORAL_TABLET | Freq: Once | ORAL | Status: DC

## 2012-11-07 MED ORDER — SODIUM CHLORIDE 0.9 % IV SOLN: 5.0000 mg/kg | Freq: Once | INTRAVENOUS | Status: DC

## 2012-11-08 ENCOUNTER — Encounter (HOSPITAL_COMMUNITY): Payer: Self-pay

## 2012-11-08 ENCOUNTER — Encounter (HOSPITAL_COMMUNITY)
Admission: RE | Admit: 2012-11-08 | Discharge: 2012-11-08 | Disposition: A | Discharge status: Home / Self Care | Payer: Medicare Other | Source: Ambulatory Visit | Attending: Internal Medicine | Admitting: Internal Medicine

## 2012-11-08 DIAGNOSIS — K5190 Ulcerative colitis, unspecified, without complications: Secondary | ICD-10-CM | POA: Insufficient documentation

## 2012-11-08 MED ORDER — SODIUM CHLORIDE 0.9 % IV SOLN
5.0000 mg/kg | Freq: Once | INTRAVENOUS | Status: AC
  Administered 2012-11-08: 600 mg via INTRAVENOUS
  Filled 2012-11-08: qty 60

## 2012-11-08 MED ORDER — ACETAMINOPHEN 325 MG PO TABS
650.0000 mg | ORAL_TABLET | Freq: Once | ORAL | Status: AC
  Administered 2012-11-08: 650 mg via ORAL
  Filled 2012-11-08: qty 2

## 2012-11-08 MED ORDER — DIPHENHYDRAMINE HCL 25 MG PO CAPS
50.0000 mg | ORAL_CAPSULE | Freq: Once | ORAL | Status: AC
  Administered 2012-11-08: 50 mg via ORAL
  Filled 2012-11-08: qty 2

## 2012-11-08 MED ORDER — SODIUM CHLORIDE 0.9 % IV SOLN
Freq: Once | INTRAVENOUS | Status: AC
  Administered 2012-11-08: 12:00:00 via INTRAVENOUS

## 2012-11-20 ENCOUNTER — Other Ambulatory Visit: Payer: Self-pay

## 2012-11-20 MED ORDER — MESALAMINE ER 500 MG PO CPCR: 2000.0000 mg | ORAL_CAPSULE | Freq: Two times a day (BID) | ORAL | Status: DC

## 2012-11-20 NOTE — Telephone Encounter (Signed)
Pt now has Fish farm manager and is requesting that a prescription for Pentasa be called in for him to [facility] in [facility].

## 2012-12-13 ENCOUNTER — Telehealth: Payer: Self-pay | Admitting: Internal Medicine

## 2012-12-13 NOTE — Telephone Encounter (Signed)
Pts mother aware. Appt for Remicade cancelled. Pts mother to call us back when shingles are completely dry and the pain is gone. Pt will be rescheduled 4 weeks after he has healed.

## 2012-12-13 NOTE — Telephone Encounter (Signed)
No remicade until 4 weeks AFTER SHINGLES COMPLETELY CLEARED UP

## 2012-12-13 NOTE — Telephone Encounter (Signed)
Pts mother states that Chi went to his PCP yesterday and he has a bad case of Shingles. He is broken out on his stomach around to his back. He is in pain. Pt was placed on antiviral and given Tylenol #3 for pain. Pt is scheduled for Remicade on 12/20/12. Pts mother wants to know how long they need to wait before rescheduling the Remicade. Dr. [name] please advise.

## 2012-12-19 ENCOUNTER — Other Ambulatory Visit (HOSPITAL_COMMUNITY): Payer: Self-pay | Admitting: Internal Medicine

## 2012-12-20 ENCOUNTER — Encounter (HOSPITAL_COMMUNITY): Payer: Medicare Other

## 2012-12-25 HISTORY — PX: GLAUCOMA SURGERY: SHX656

## 2013-01-13 ENCOUNTER — Telehealth: Payer: Self-pay | Admitting: Internal Medicine

## 2013-01-14 ENCOUNTER — Other Ambulatory Visit: Payer: Self-pay

## 2013-01-14 MED ORDER — ZOLPIDEM TARTRATE 10 MG PO TABS: 10.0000 mg | ORAL_TABLET | Freq: Every evening | ORAL | Status: DC | PRN

## 2013-01-14 NOTE — Telephone Encounter (Signed)
Told pt I had just faxed Ambien rx to [facility] per his request, per Dr. [name].  Patient acknowledged and agreed to pick it up

## 2013-02-03 ENCOUNTER — Encounter: Payer: Self-pay | Admitting: Internal Medicine

## 2013-02-25 ENCOUNTER — Telehealth: Payer: Self-pay

## 2013-02-25 NOTE — Telephone Encounter (Signed)
Called to check on pt., his Remicade has been on hold since 12/13/12 due to Shingles. Pts mother states that in January the pt started having problems with his eye. Pt has been going back and forth to [facility]. 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 Pt has been on high dose steroid drops for increased pressure in that eye. Pt  Has been going to [facility] twice a week some weeks. Pt goes back to [facility] 03/03/13 for another check. Mother states they will call back after that visit and let us know what the doctor there says about restarting the Remicade. Dr. [name] notified.

## 2013-02-25 NOTE — Telephone Encounter (Signed)
I'm so sorry about his eye problems. Thank you for the update. Keep [place] posted.

## 2013-03-04 ENCOUNTER — Telehealth: Payer: Self-pay | Admitting: Internal Medicine

## 2013-03-04 NOTE — Telephone Encounter (Signed)
Pt went to [facility] today for his eyes. Pt states he may have to have surgery if the pressure in his eyes does not go down. He has another appt there on Friday. Pt states the eye doctor there said he does not have an infection in his eye and he should be ok for Remicade. Pt wants to know if he should go ahead and schedule an infusion of remicade or wait until he finds out if he needs surgery. Pt also due for Colon recall and asked about scheduling that also. Let pt know I would ask Dr. Blanch Media opinion and let him know. Please advise.

## 2013-03-04 NOTE — Telephone Encounter (Signed)
I think it would be okay to resume Remicade, if he has been cleared by his eye surgeon. In terms of colonoscopy, he can schedule that sometime after his eye issues have been taking care of. Wish him my  best

## 2013-03-05 NOTE — Telephone Encounter (Signed)
Spoke with pts mother and she is aware of Dr. Blanch Media recommendations. States they will call us back after his appt on Friday at [facility] with an update.

## 2013-03-25 DIAGNOSIS — G479 Sleep disorder, unspecified: Secondary | ICD-10-CM | POA: Insufficient documentation

## 2013-04-08 ENCOUNTER — Telehealth: Payer: Self-pay | Admitting: Internal Medicine

## 2013-04-08 ENCOUNTER — Other Ambulatory Visit: Payer: Self-pay

## 2013-04-08 DIAGNOSIS — K5190 Ulcerative colitis, unspecified, without complications: Secondary | ICD-10-CM

## 2013-04-08 MED ORDER — METHYLPREDNISOLONE 4 MG PO KIT: PACK | ORAL | Status: DC

## 2013-04-08 NOTE — Telephone Encounter (Signed)
Patient has been cleared his eye surgeon to resume Remicade.  Discussed with Dr. [name] patient does not need induction dose, just resume at 79m/kg q 6 weeks.  Patient will need to have a medrol dose pack and start that 2 days prior to Remicade.  Patient is scheduled for 04/15/13 12:00.  He is aware to start Medrol dose pack on Sunday and complete the pack as instructed.  Patient verbalized understanding of all instructions.

## 2013-04-09 ENCOUNTER — Telehealth: Payer: Self-pay

## 2013-04-09 MED ORDER — ZOLPIDEM TARTRATE 10 MG PO TABS: 10.0000 mg | ORAL_TABLET | Freq: Every evening | ORAL | Status: DC | PRN

## 2013-04-09 NOTE — Telephone Encounter (Signed)
Received fax request for refill of Ambien.  Per Dr. [name], Faxed the refill to [facility] and then called patient to let him know.  Patient acknowledged and agreed to pick it up

## 2013-04-09 NOTE — Telephone Encounter (Signed)
Message copied by Audrea Muscat on Wed Apr 09, 2013  9:21 AM ------      Message from: Irene Shipper      Created: Wed Apr 09, 2013  9:09 AM       Ok to refill      ----- Message -----         From: Audrea Muscat, CMA         Sent: 04/09/2013   8:53 AM           To: Irene Shipper, MD            Patient requesting refill of Ambien.  Last filled 01-13-13 #30 with 2 refills.  Ok to refill?       ------

## 2013-04-15 ENCOUNTER — Encounter (HOSPITAL_COMMUNITY)
Admission: RE | Admit: 2013-04-15 | Discharge: 2013-04-15 | Disposition: A | Discharge status: Home / Self Care | Payer: Medicare Other | Source: Ambulatory Visit | Attending: Internal Medicine | Admitting: Internal Medicine

## 2013-04-15 VITALS — BP 144/85 | HR 79 | Temp 97.9°F | Resp 20 | Ht 70.5 in | Wt 274.2 lb

## 2013-04-15 DIAGNOSIS — K5190 Ulcerative colitis, unspecified, without complications: Secondary | ICD-10-CM | POA: Insufficient documentation

## 2013-04-15 MED ORDER — DIPHENHYDRAMINE HCL 25 MG PO TABS
50.0000 mg | ORAL_TABLET | Freq: Every day | ORAL | Status: DC
  Administered 2013-04-15: 50 mg via ORAL
  Filled 2013-04-15 (×3): qty 2

## 2013-04-15 MED ORDER — SODIUM CHLORIDE 0.9 % IV SOLN
5.0000 mg/kg | INTRAVENOUS | Status: DC
  Administered 2013-04-15: 600 mg via INTRAVENOUS
  Filled 2013-04-15: qty 60

## 2013-04-15 MED ORDER — ACETAMINOPHEN 325 MG PO TABS
650.0000 mg | ORAL_TABLET | Freq: Every day | ORAL | Status: DC
  Administered 2013-04-15: 650 mg via ORAL
  Filled 2013-04-15: qty 2

## 2013-04-15 MED ORDER — SODIUM CHLORIDE 0.9 % IV SOLN
INTRAVENOUS | Status: DC
  Administered 2013-04-15: 12:00:00 via INTRAVENOUS

## 2013-04-24 ENCOUNTER — Encounter: Payer: Self-pay | Admitting: Internal Medicine

## 2013-04-24 ENCOUNTER — Ambulatory Visit (INDEPENDENT_AMBULATORY_CARE_PROVIDER_SITE_OTHER): Payer: Medicare Other | Admitting: Internal Medicine

## 2013-04-24 VITALS — BP 146/90 | HR 96 | Ht 70.5 in | Wt 277.1 lb

## 2013-04-24 DIAGNOSIS — K5190 Ulcerative colitis, unspecified, without complications: Secondary | ICD-10-CM

## 2013-04-24 NOTE — Patient Instructions (Addendum)
Please call our office in the fall to schedule your next surveillance colonoscopy

## 2013-04-24 NOTE — Progress Notes (Signed)
HISTORY OF PRESENT ILLNESS:  Matthew [name] is a 47 y.o. 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 He is intolerant to immunomodulators. His colitis is being treated with oral mesalamine and Remicade 5 mg per kilogram every 6 weeks. I last saw him in the office January 2013. He sought extender September 2013 for acute febrile illness empirically treated with doxycycline and Flagyl. His last colonoscopy was 2 years ago. Due this year. Around December he developed shingles. Remicade was stopped. He also developed problems with eye infection. Problems of now resolved after medical attention. He resumed Remicade last week. Previous dose was in November. He did notice worsening of colitis type symptoms off Remicade such as increased frequency of bowel movements and some breakthrough bleeding. Appetite and weight have been stable. No fevers. Transient rectal pain with defecation and associated red blood on the tissue. No other issues.  REVIEW OF SYSTEMS:  All non-GI ROS negative except for arthritis, visual change, headaches, itching, skin rash, sleeping problems, ankle edema  Past Medical History  Diagnosis Date  . Ulcerative colitis, unspecified   . Glaucoma(365)   . Primary generalized (osteo)arthritis   . Inflammatory bowel disease   . Colon polyps     2007    Past Surgical History  Procedure Laterality Date  . Ventral hernia repair    . Colon resection    . Eye surgery    . Cornea transplant both eyes Bilateral   . Glaucoma surgery Left     Social History Matthew [name]  reports that he has never smoked. He has never used smokeless tobacco. He reports that he does not drink alcohol or use illicit drugs.  family history includes Breast cancer in his cousin; Colitis in his father; Colon cancer in his maternal grandfather; Colon  polyps in his father and mother; Heart disease in his father and paternal grandmother; Liver cancer in his maternal grandfather; and Prostate cancer in his cousin.  Allergies  Allergen Reactions  . Contrast Media (Iodinated Diagnostic Agents) Hives  . Dimetapp Allergy Sinus (Brompheniramine-Ppa-Apap) Hives       PHYSICAL EXAMINATION: Vital signs: BP 146/90  Pulse 96  Ht 5' 10.5" (1.791 m)  Wt 277 lb 2 oz (125.703 kg)  BMI 39.19 kg/m2 General: Well-developed, well-nourished, no acute distress HEENT: Irregular left pupil, Sclerae are anicteric, conjunctiva pink. Oral mucosa intact Lungs: Clear Heart: Regular Abdomen: soft, obese, nontender, nondistended, no obvious ascites, no peritoneal signs, normal bowel sounds. No organomegaly. Multiple prior surgical incisions well-healed Extremities: No edema Psychiatric: alert and oriented x3. Cooperative   ASSESSMENT:  #1. Left-sided ulcerative colitis status post subtotal colectomy. Recently resume Remicade after holiday for eye infection and shingles. Continues on mesalamine as well #2. General medical problems. Stable   PLAN:  #1. Continue Remicade 5 mg per kilogram IV every 6 weeks #2. Continue mesalamine #3. Surveillance colonoscopy. I would wait until the fall, after he has been on Remicade for several cycles. He can contact the pre-visit nurse at that time and arrange direct colonoscopy. He knows to contact the office in the interim for any questions or problems.

## 2013-04-27 ENCOUNTER — Other Ambulatory Visit: Payer: Self-pay | Admitting: Internal Medicine

## 2013-05-27 ENCOUNTER — Encounter (HOSPITAL_COMMUNITY)
Admission: RE | Admit: 2013-05-27 | Discharge: 2013-05-27 | Disposition: A | Discharge status: Home / Self Care | Payer: Medicare Other | Source: Ambulatory Visit | Attending: Internal Medicine | Admitting: Internal Medicine

## 2013-05-27 VITALS — BP 137/82 | HR 90 | Temp 97.6°F | Resp 18 | Ht 70.5 in | Wt 278.0 lb

## 2013-05-27 DIAGNOSIS — K5190 Ulcerative colitis, unspecified, without complications: Secondary | ICD-10-CM | POA: Insufficient documentation

## 2013-05-27 MED ORDER — DIPHENHYDRAMINE HCL 25 MG PO CAPS
50.0000 mg | ORAL_CAPSULE | Freq: Every day | ORAL | Status: DC
  Administered 2013-05-27: 50 mg via ORAL
  Filled 2013-05-27 (×3): qty 2

## 2013-05-27 MED ORDER — ACETAMINOPHEN 325 MG PO TABS
650.0000 mg | ORAL_TABLET | Freq: Every day | ORAL | Status: DC
  Administered 2013-05-27: 650 mg via ORAL
  Filled 2013-05-27: qty 2

## 2013-05-27 MED ORDER — SODIUM CHLORIDE 0.9 % IV SOLN
5.0000 mg/kg | INTRAVENOUS | Status: DC
  Administered 2013-05-27: 600 mg via INTRAVENOUS
  Filled 2013-05-27: qty 60

## 2013-05-27 MED ORDER — SODIUM CHLORIDE 0.9 % IV SOLN
INTRAVENOUS | Status: DC
  Administered 2013-05-27: 12:00:00 via INTRAVENOUS

## 2013-07-08 ENCOUNTER — Encounter (HOSPITAL_COMMUNITY): Payer: Self-pay

## 2013-07-08 ENCOUNTER — Encounter (HOSPITAL_COMMUNITY)
Admission: RE | Admit: 2013-07-08 | Discharge: 2013-07-08 | Disposition: A | Discharge status: Home / Self Care | Payer: Medicare Other | Source: Ambulatory Visit | Attending: Internal Medicine | Admitting: Internal Medicine

## 2013-07-08 VITALS — BP 140/80 | HR 86 | Temp 97.7°F | Resp 20 | Ht 70.5 in | Wt 275.0 lb

## 2013-07-08 DIAGNOSIS — K5190 Ulcerative colitis, unspecified, without complications: Secondary | ICD-10-CM | POA: Insufficient documentation

## 2013-07-08 MED ORDER — ACETAMINOPHEN 325 MG PO TABS
650.0000 mg | ORAL_TABLET | Freq: Every day | ORAL | Status: DC
  Administered 2013-07-08: 650 mg via ORAL
  Filled 2013-07-08: qty 2

## 2013-07-08 MED ORDER — DIPHENHYDRAMINE HCL 25 MG PO CAPS
50.0000 mg | ORAL_CAPSULE | Freq: Every day | ORAL | Status: DC
  Administered 2013-07-08: 50 mg via ORAL
  Filled 2013-07-08: qty 2

## 2013-07-08 MED ORDER — SODIUM CHLORIDE 0.9 % IV SOLN
INTRAVENOUS | Status: DC
  Administered 2013-07-08: 12:00:00 via INTRAVENOUS

## 2013-07-08 MED ORDER — SODIUM CHLORIDE 0.9 % IV SOLN
5.0000 mg/kg | INTRAVENOUS | Status: AC
  Administered 2013-07-08: 600 mg via INTRAVENOUS
  Filled 2013-07-08: qty 60

## 2013-07-14 ENCOUNTER — Telehealth: Payer: Self-pay

## 2013-07-15 ENCOUNTER — Telehealth: Payer: Self-pay

## 2013-07-15 MED ORDER — ZOLPIDEM TARTRATE 10 MG PO TABS: 10.0000 mg | ORAL_TABLET | Freq: Every evening | ORAL | Status: DC | PRN

## 2013-07-15 NOTE — Telephone Encounter (Signed)
Ambien rx refilled per Dr. [name]

## 2013-07-15 NOTE — Telephone Encounter (Signed)
Faxed Rx for Ambien per Dr. [name]

## 2013-07-15 NOTE — Telephone Encounter (Signed)
Faxed rx for Ambien

## 2013-07-15 NOTE — Telephone Encounter (Signed)
Message copied by Audrea Muscat on Tue Jul 15, 2013  9:29 AM ------      Message from: Irene Shipper      Created: Mon Jul 14, 2013  4:41 PM       Okay refill      ----- Message -----         From: Audrea Muscat, CMA         Sent: 07/14/2013   4:22 PM           To: Irene Shipper, MD            Patient requesting refill of Ambien.  Last filled 04/09/2013, # 30 with 2 refills.  Ok to refill?       ------

## 2013-07-22 ENCOUNTER — Encounter: Payer: Self-pay | Admitting: Internal Medicine

## 2013-07-24 IMAGING — CT CT ABD-PELV W/ CM
2 of 5 series · 17 of 46 positions shown, 19 images · IV contrast (Omnipaque 300)
Comparison: None.

CLINICAL DATA: Ulcerative colitis.  Abdominal pain, diarrhea, and
fever.

CT ABDOMEN AND PELVIS WITH CONTRAST
TECHNIQUE: Multidetector CT imaging of the abdomen and pelvis was
performed following the standard protocol during bolus
administration of intravenous contrast.
Contrast: 100mL OMNIPAQUE IOHEXOL 300 MG/ML  SOLN

[Series 2: abd/ pel 5mm · axial · 0.84mm/px · z∈[-481,-26]mm · 14 of 103 slices shown, 16 images]
[im 6/103  soft-tissue]
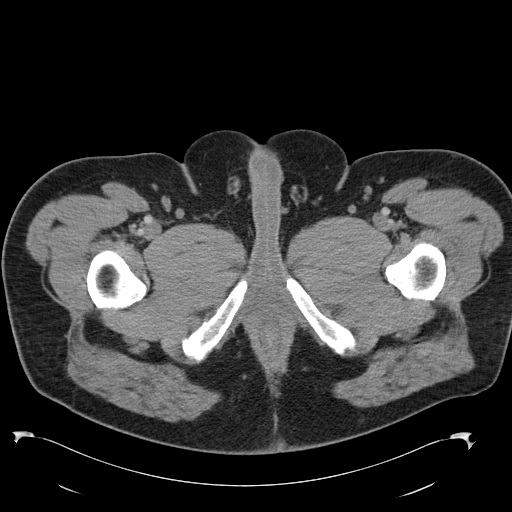
[im 6/103  bone]
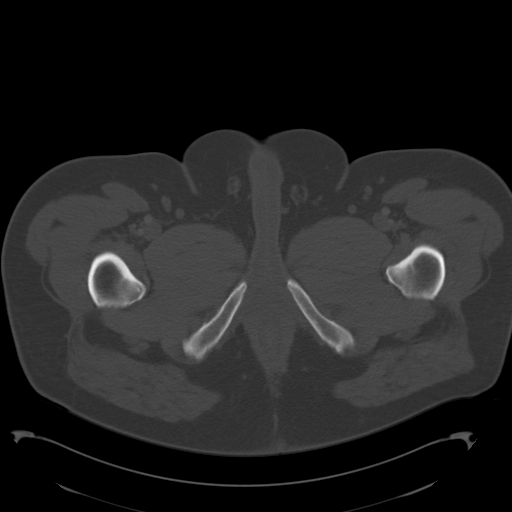
[im 16/103  soft-tissue]
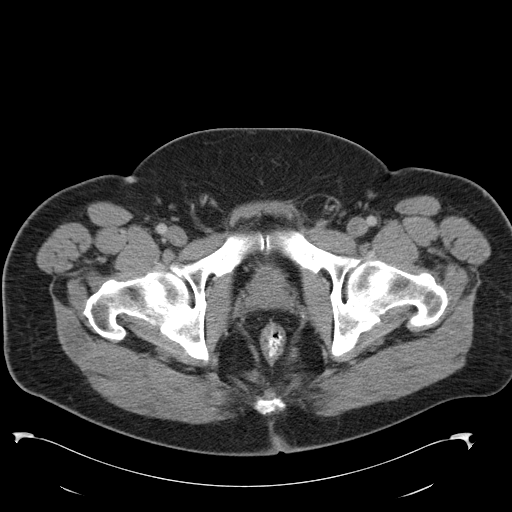
[im 21/103  soft-tissue]
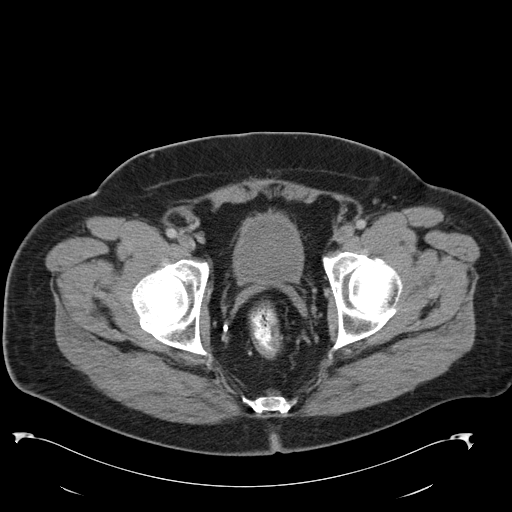
[im 26/103  soft-tissue]
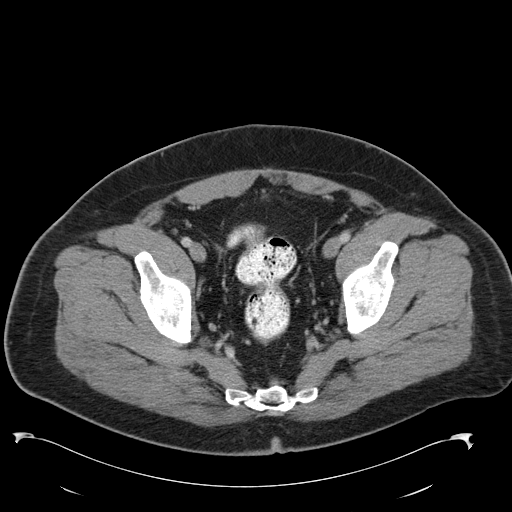
[im 36/103  soft-tissue]
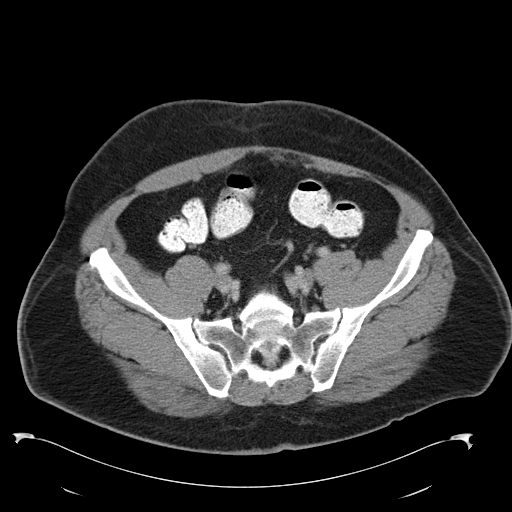
[im 41/103  soft-tissue]
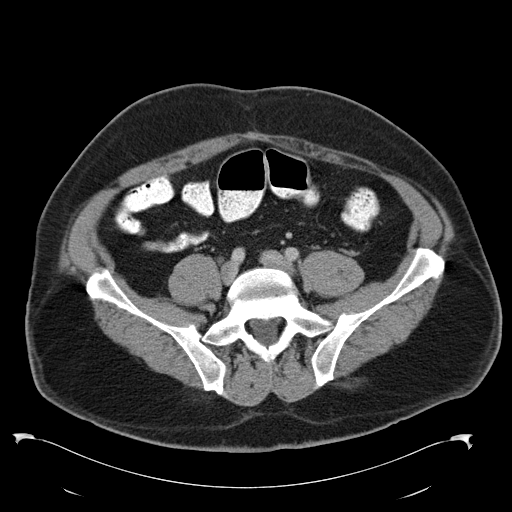
[im 46/103  soft-tissue]
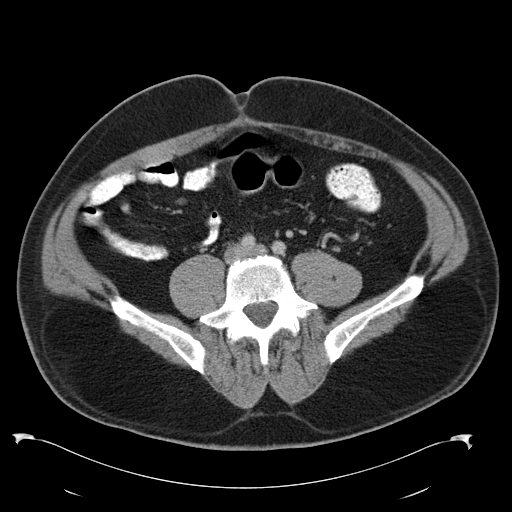
[im 57/103  soft-tissue]
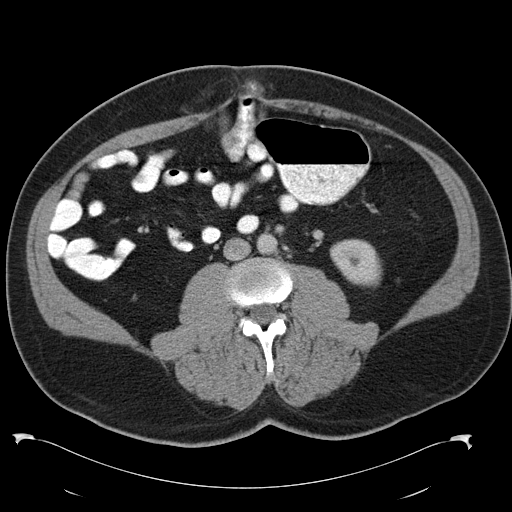
[im 62/103  soft-tissue]
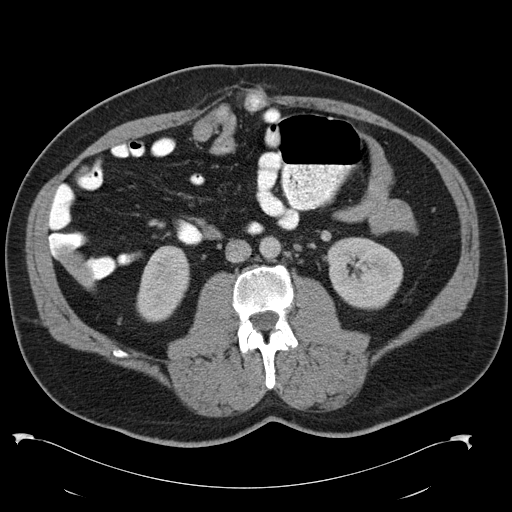
[im 62/103  bone]
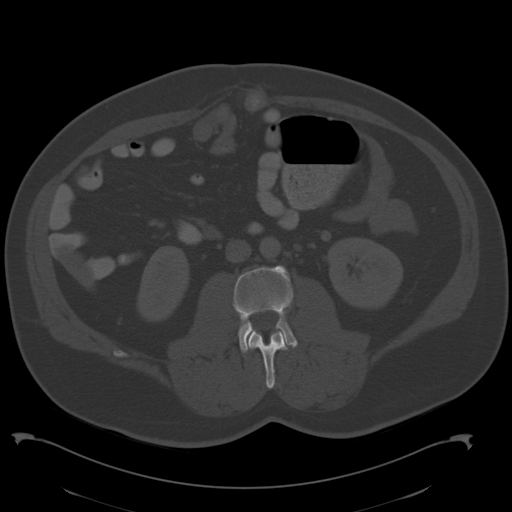
[im 67/103  soft-tissue]
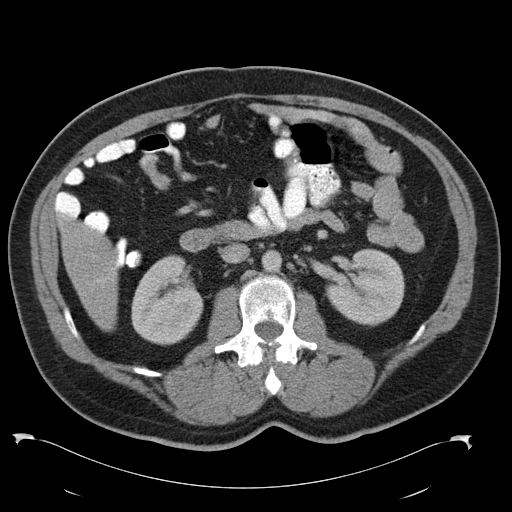
[im 77/103  soft-tissue]
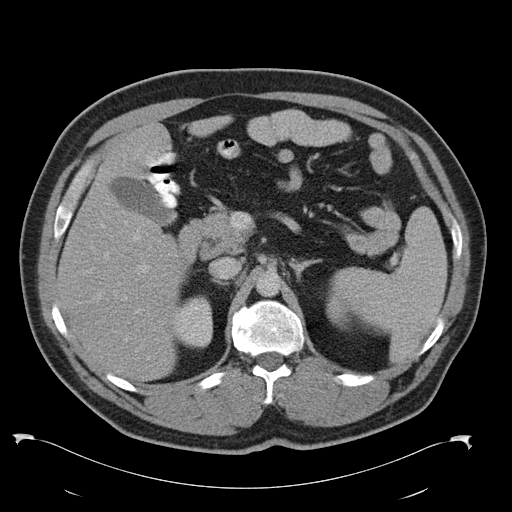
[im 82/103  soft-tissue]
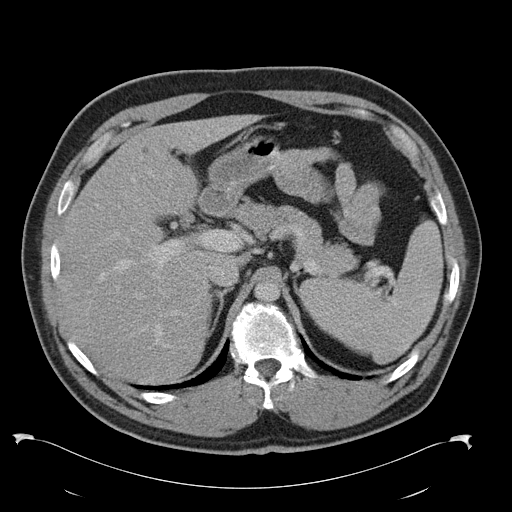
[im 87/103  soft-tissue]
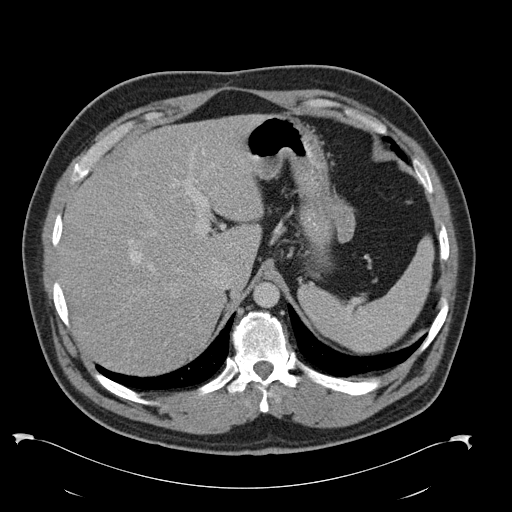
[im 97/103  soft-tissue]
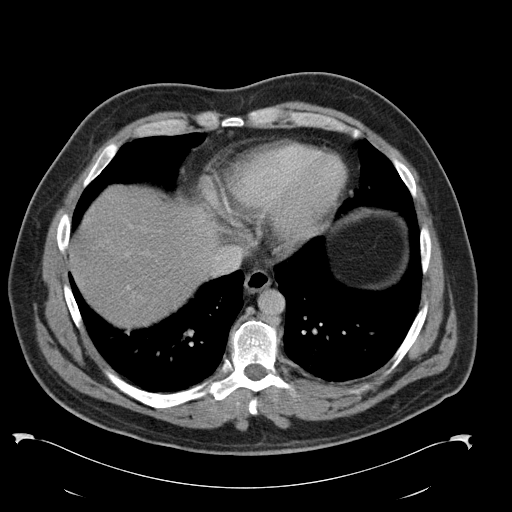

[Series 602: cor · coronal · 1.04mm/px · 3 of 143 slices shown]
[im 48/143  soft-tissue]
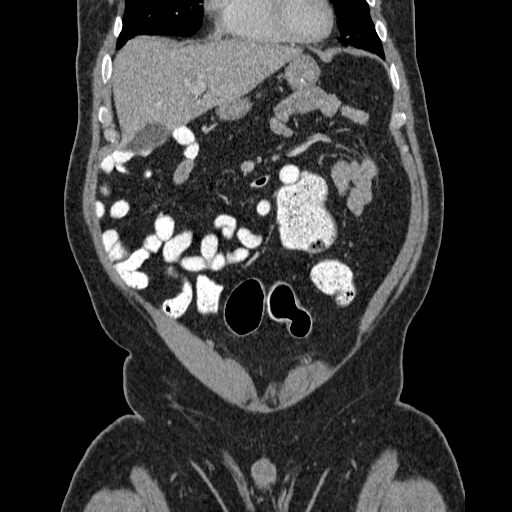
[im 64/143  soft-tissue]
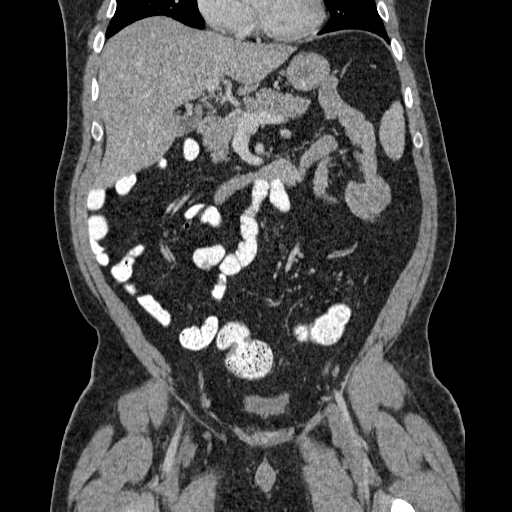
[im 79/143  soft-tissue]
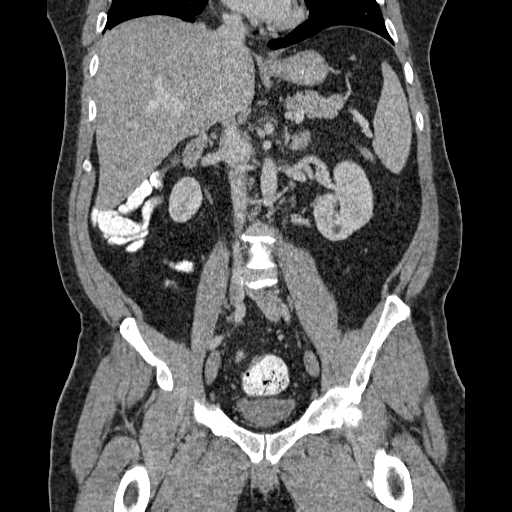

[17 of 46 positions shown; findings below may reference images not displayed]

FINDINGS: Postop changes are seen from prior sub total colectomy.
No evidence of bowel wall thickening or dilatation.  No evidence of
other inflammatory changes within the mesentery or signs of fistula
or abscess.  A small epigastric ventral hernia is seen containing
small bowel, however there is no evidence of obstruction or
ischemia.

The abdominal parenchymal organs are normal appearance.
Gallbladder is unremarkable.  A tiny right renal cyst is seen
however there is no evidence of renal mass or hydronephrosis.  No
soft tissue masses or lymphadenopathy identified within the abdomen
or pelvis.
IMPRESSION: 1.  No radiographic signs of active inflammatory bowel disease or
other acute findings.
2.  Small ventral hernia containing small bowel.  No evidence of
bowel ischemia or obstruction.

## 2013-07-25 IMAGING — CR DG CHEST 2V
2 series · 2 of 2 positions shown · non-contrast
Comparison: Chest x-ray of 09/03/2012

CLINICAL DATA: Cough, fever, shortness of breath

CHEST - 2 VIEW

[view not recorded (1 of 2)]
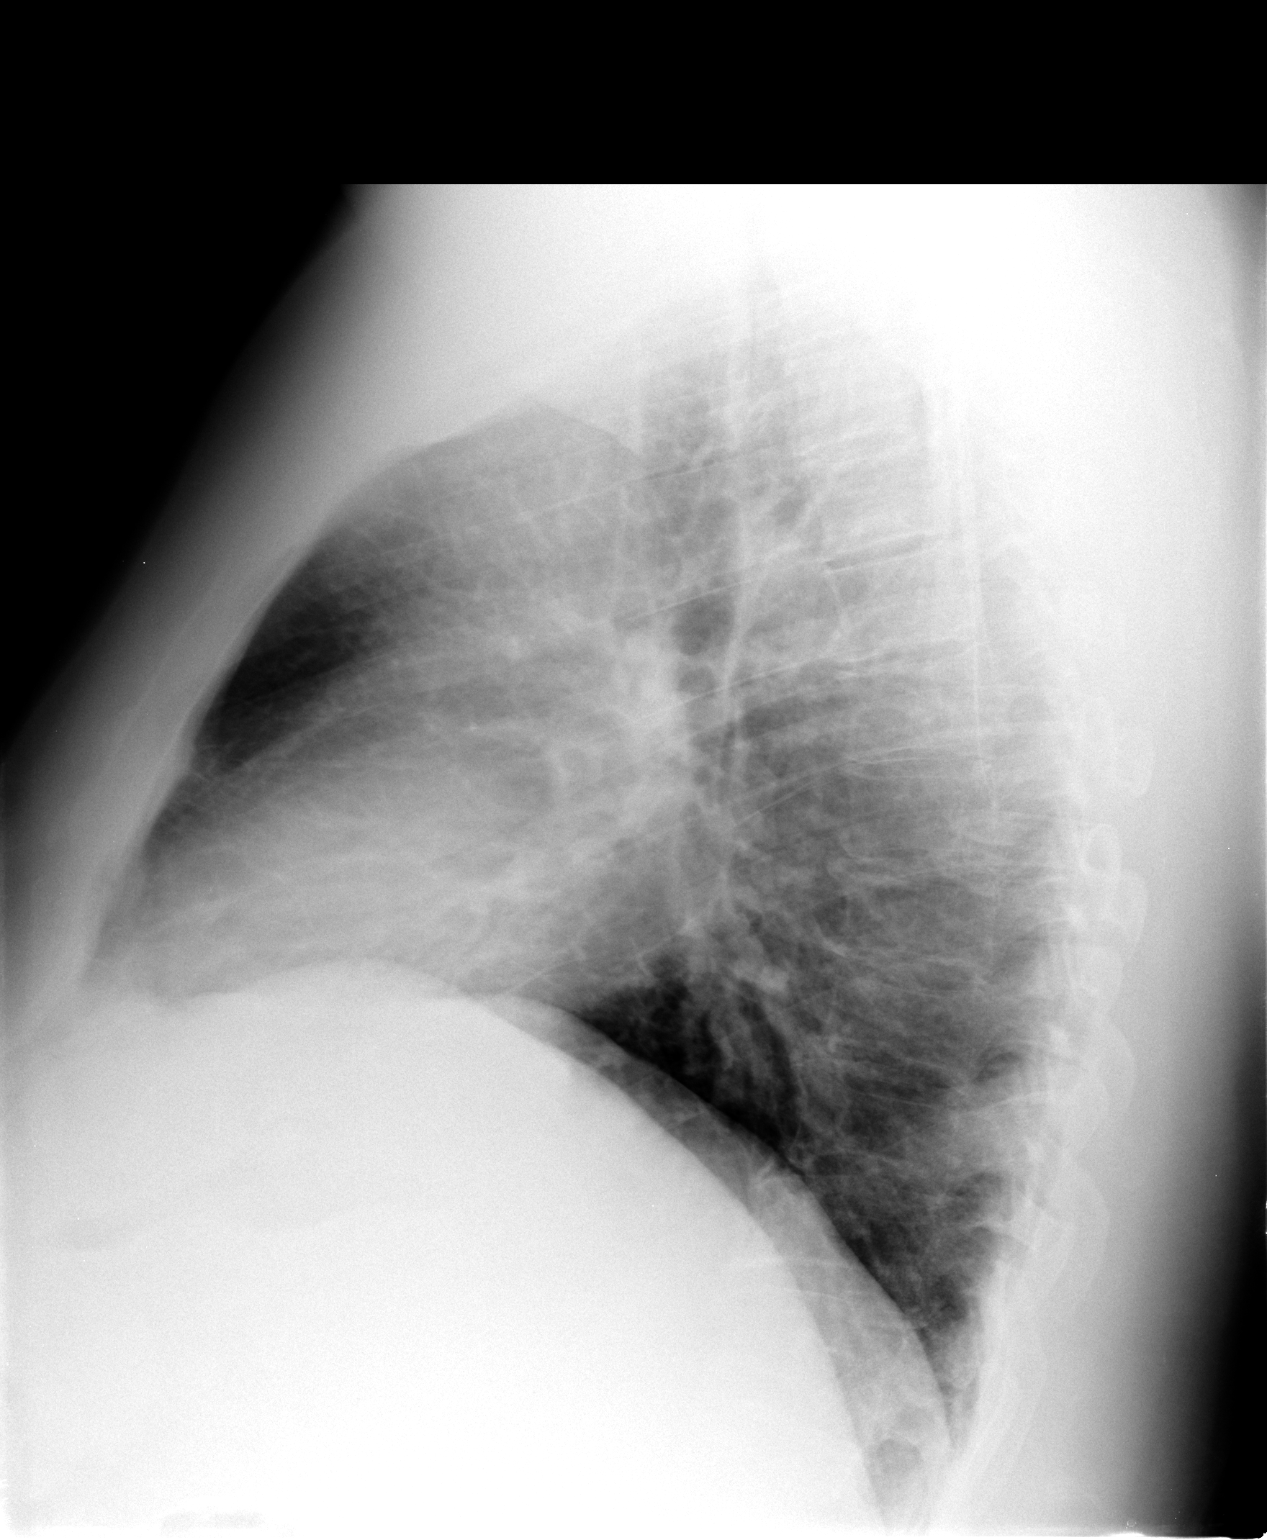

[view not recorded (2 of 2)]
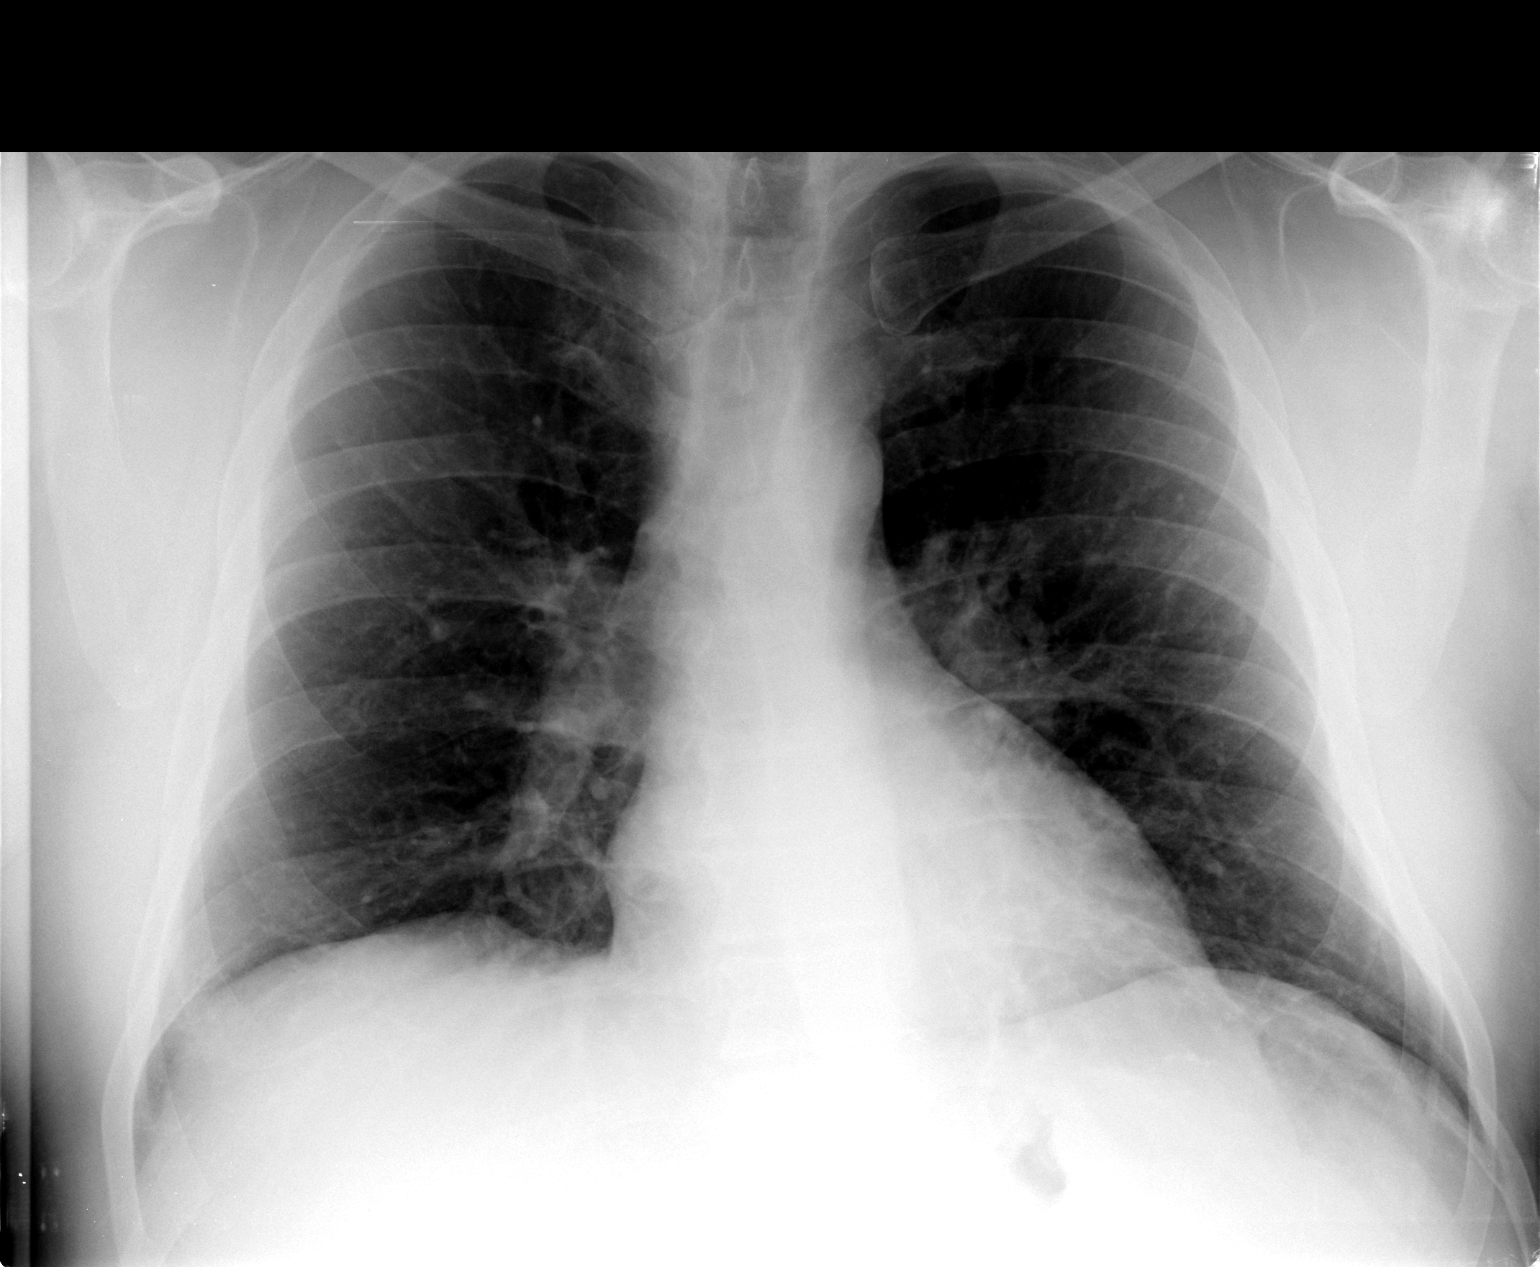

[2 of 2 positions shown; findings below may reference images not displayed]

FINDINGS: The lungs are clear.  Mediastinal contours appear normal.
Minimal peribronchial thickening is noted.  The heart is within
normal limits in size.  No bony abnormality is seen.
IMPRESSION: No active lung disease.

## 2013-07-29 ENCOUNTER — Telehealth: Payer: Self-pay | Admitting: Internal Medicine

## 2013-07-29 NOTE — Telephone Encounter (Signed)
I don't have any ideas. Do you?

## 2013-07-29 NOTE — Telephone Encounter (Signed)
Pt states that now that he is eligible for insurance the Remicade is costing him $918.00 every 6 weeks and that is after his insurance has paid. Pt states he will be going into the doughnut hole soon. Pt wants to know if there is something else that he could be treated with that would not cost him as much. Pt used to get pt assistance but now that he has insurance he does not qualify for the Remicade assistance. Dr. [name] please advise.

## 2013-07-30 NOTE — Telephone Encounter (Signed)
Called Remicade representative, Matthew [name], and left a message for her to call back. Will ask if there is anything or any ideas she may have that might help the pt. Matthew [name] aware that I am waiting on a return phone call.

## 2013-07-30 NOTE — Telephone Encounter (Signed)
Spoke with Remicade representative and since the pt has medicare the only help he could get was possibly from a foundation. Called [employer] and was given the name of [employer] and the Pt Matthew [name]. Neither foundation has funds available for UC pts at this time.  Dr. [name] would Matthew [name] be a candidate for Humira? I am not sure how much that would cost them.

## 2013-07-31 NOTE — Telephone Encounter (Signed)
Certainly. Good idea. Look into that with him. Thanks [employer]

## 2013-07-31 NOTE — Telephone Encounter (Signed)
Thanks

## 2013-07-31 NOTE — Telephone Encounter (Signed)
Called [place] and [place] and let them know the pt could not afford the Remicade and no help was available from the foundations. [employer] is going to file an appeal and see if he can get assistance for the Remicade from them. Appeal will take 5 days and they will contact the pt with the decision. Will hold on checking for the Humira pending the appeal decision.

## 2013-08-01 NOTE — Telephone Encounter (Signed)
Received fax from [employer]. Filled out the parts for the physician and then called pt. Pt requests that form be mailed to them to complete their part. Will wait for completed form to be returned.

## 2013-08-05 ENCOUNTER — Encounter: Payer: Self-pay | Admitting: Internal Medicine

## 2013-08-05 ENCOUNTER — Ambulatory Visit (INDEPENDENT_AMBULATORY_CARE_PROVIDER_SITE_OTHER): Payer: Medicare Other | Admitting: Internal Medicine

## 2013-08-05 DIAGNOSIS — K5190 Ulcerative colitis, unspecified, without complications: Secondary | ICD-10-CM

## 2013-08-05 NOTE — Telephone Encounter (Signed)
Form returned by pt and faxed to [employer]. Will wait to hear from company.

## 2013-08-07 LAB — TB SKIN TEST
Induration: 0 mm
TB Skin Test: NEGATIVE

## 2013-08-15 ENCOUNTER — Other Ambulatory Visit: Payer: Self-pay | Admitting: Internal Medicine

## 2013-08-15 DIAGNOSIS — K5190 Ulcerative colitis, unspecified, without complications: Secondary | ICD-10-CM

## 2013-08-18 NOTE — Telephone Encounter (Signed)
Appeal with [employer] was denied. Pt happens to have a [employer] rx plan and was approved to have the Remicade for zero copay. Prior Auth code [id number], pts account# [id number]. Pts mother aware.

## 2013-08-19 ENCOUNTER — Other Ambulatory Visit (HOSPITAL_COMMUNITY): Payer: Self-pay | Admitting: Internal Medicine

## 2013-08-19 ENCOUNTER — Encounter (HOSPITAL_COMMUNITY)
Admission: RE | Admit: 2013-08-19 | Discharge: 2013-08-19 | Disposition: A | Discharge status: Home / Self Care | Payer: Medicare Other | Source: Ambulatory Visit | Attending: Internal Medicine | Admitting: Internal Medicine

## 2013-08-19 ENCOUNTER — Encounter (HOSPITAL_COMMUNITY): Payer: Self-pay

## 2013-08-19 VITALS — BP 141/86 | HR 98 | Temp 97.9°F | Resp 18 | Wt 276.1 lb

## 2013-08-19 DIAGNOSIS — K5190 Ulcerative colitis, unspecified, without complications: Secondary | ICD-10-CM | POA: Insufficient documentation

## 2013-08-19 MED ORDER — DIPHENHYDRAMINE HCL 25 MG PO CAPS
50.0000 mg | ORAL_CAPSULE | Freq: Once | ORAL | Status: AC
  Administered 2013-08-19: 50 mg via ORAL
  Filled 2013-08-19: qty 2

## 2013-08-19 MED ORDER — SODIUM CHLORIDE 0.9 % IV SOLN
5.0000 mg/kg | Freq: Once | INTRAVENOUS | Status: AC
  Administered 2013-08-19: 600 mg via INTRAVENOUS
  Filled 2013-08-19: qty 60

## 2013-08-19 MED ORDER — ACETAMINOPHEN 325 MG PO TABS
650.0000 mg | ORAL_TABLET | Freq: Once | ORAL | Status: AC
  Administered 2013-08-19: 650 mg via ORAL
  Filled 2013-08-19: qty 2

## 2013-08-19 MED ORDER — SODIUM CHLORIDE 0.9 % IV SOLN
Freq: Once | INTRAVENOUS | Status: AC
  Administered 2013-08-19: 12:00:00 via INTRAVENOUS

## 2013-08-20 ENCOUNTER — Telehealth: Payer: Self-pay | Admitting: Internal Medicine

## 2013-08-20 NOTE — Telephone Encounter (Signed)
[facility] called and just wanted to let us know that they updated the rx and the pt only needs 6vials of Remicade instead of 7 vials.

## 2013-09-25 ENCOUNTER — Ambulatory Visit (AMBULATORY_SURGERY_CENTER): Payer: Self-pay | Admitting: *Deleted

## 2013-09-25 VITALS — Ht 70.5 in | Wt 279.4 lb

## 2013-09-25 DIAGNOSIS — K5190 Ulcerative colitis, unspecified, without complications: Secondary | ICD-10-CM

## 2013-09-25 MED ORDER — MOVIPREP 100 G PO SOLR: ORAL | Status: DC

## 2013-09-25 NOTE — Progress Notes (Signed)
No allergies to eggs or soy. No problems with anesthesia.

## 2013-09-30 ENCOUNTER — Encounter (HOSPITAL_COMMUNITY)
Admission: RE | Admit: 2013-09-30 | Discharge: 2013-09-30 | Disposition: A | Discharge status: Home / Self Care | Payer: Medicare Other | Source: Ambulatory Visit | Attending: Internal Medicine | Admitting: Internal Medicine

## 2013-09-30 ENCOUNTER — Encounter (HOSPITAL_COMMUNITY): Payer: Self-pay

## 2013-09-30 VITALS — BP 134/79 | HR 80 | Temp 97.4°F | Resp 18 | Wt 277.1 lb

## 2013-09-30 DIAGNOSIS — K5190 Ulcerative colitis, unspecified, without complications: Secondary | ICD-10-CM

## 2013-09-30 MED ORDER — ACETAMINOPHEN 325 MG PO TABS
650.0000 mg | ORAL_TABLET | Freq: Once | ORAL | Status: AC
  Administered 2013-09-30: 650 mg via ORAL
  Filled 2013-09-30: qty 2

## 2013-09-30 MED ORDER — DIPHENHYDRAMINE HCL 25 MG PO CAPS
50.0000 mg | ORAL_CAPSULE | Freq: Once | ORAL | Status: AC
  Administered 2013-09-30: 50 mg via ORAL
  Filled 2013-09-30: qty 2

## 2013-09-30 MED ORDER — SODIUM CHLORIDE 0.9 % IV SOLN
Freq: Once | INTRAVENOUS | Status: AC
  Administered 2013-09-30: 20 mL/h via INTRAVENOUS

## 2013-09-30 MED ORDER — SODIUM CHLORIDE 0.9 % IV SOLN
5.0000 mg/kg | Freq: Once | INTRAVENOUS | Status: AC
  Administered 2013-09-30: 600 mg via INTRAVENOUS
  Filled 2013-09-30: qty 60

## 2013-10-08 ENCOUNTER — Telehealth: Payer: Self-pay

## 2013-10-08 ENCOUNTER — Other Ambulatory Visit: Payer: Self-pay | Admitting: Internal Medicine

## 2013-10-08 MED ORDER — ZOLPIDEM TARTRATE 10 MG PO TABS: 10.0000 mg | ORAL_TABLET | Freq: Every evening | ORAL | Status: DC | PRN

## 2013-10-08 NOTE — Telephone Encounter (Signed)
Message copied by Audrea Muscat on Wed Oct 08, 2013  4:32 PM ------      Message from: Irene Shipper      Created: Wed Oct 08, 2013  4:29 PM       Okay to refill      ----- Message -----         From: Audrea Muscat, CMA         Sent: 10/08/2013   4:26 PM           To: Irene Shipper, MD            Patient requesting refill of Ambien; last filled 07/15/13, #30 x 2 refills.  Ok to refill?       ------

## 2013-10-08 NOTE — Telephone Encounter (Signed)
Refilled per Dr. [name]

## 2013-10-09 ENCOUNTER — Ambulatory Visit (AMBULATORY_SURGERY_CENTER): Payer: Medicare Other | Admitting: Internal Medicine

## 2013-10-09 ENCOUNTER — Encounter: Payer: Self-pay | Admitting: Internal Medicine

## 2013-10-09 VITALS — BP 131/93 | HR 76 | Temp 96.0°F | Resp 15 | Ht 70.5 in | Wt 279.0 lb

## 2013-10-09 DIAGNOSIS — K5289 Other specified noninfective gastroenteritis and colitis: Secondary | ICD-10-CM

## 2013-10-09 DIAGNOSIS — D126 Benign neoplasm of colon, unspecified: Secondary | ICD-10-CM

## 2013-10-09 DIAGNOSIS — K5190 Ulcerative colitis, unspecified, without complications: Secondary | ICD-10-CM

## 2013-10-09 MED ORDER — SODIUM CHLORIDE 0.9 % IV SOLN: 500.0000 mL | INTRAVENOUS | Status: DC

## 2013-10-09 NOTE — Progress Notes (Signed)
Lidocaine-40mg IV prior to Propofol InductionPropofol given over incremental dosages 

## 2013-10-09 NOTE — Telephone Encounter (Signed)
Refilled Ambien per Dr. [name]

## 2013-10-09 NOTE — Op Note (Signed)
[facility]  [address] [place], [address]   COLONOSCOPY PROCEDURE REPORT  PATIENT: Leonides, Minder  MR#: [id number] BIRTHDATE: 1966-07-08 , 57  yrs. old GENDER: Male ENDOSCOPIST: Eustace Quail, MD REFERRED [id number] Program Recall PROCEDURE DATE:  10/09/2013 PROCEDURE:   Colonoscopy with biopsy and Colonoscopy with snare polypectomy x 2 First Screening Colonoscopy - Avg.  risk and is 50 yrs.  old or older - No.  Prior Negative Screening - Now for repeat screening. N/A  History of Adenoma - Now for follow-up colonoscopy & has been > or = to 3 yrs.  N/A  Polyps Removed Today? Yes. ASA CLASS:   Class II INDICATIONS:High risk patient with previously diagnosed UC approximately 20 years ago. Status post subtotal colectomy 2001 (concerns about indeterminate colitis possibly Crohn's). On Remicade for treatment of active left colitis symptoms. Now for routine surveillance. Doing well clinically. Last examination February 2012. MEDICATIONS: MAC sedation, administered by CRNA and propofol (Diprivan) 373m IV  DESCRIPTION OF PROCEDURE:   After the risks benefits and alternatives of the procedure were thoroughly explained, informed consent was obtained.  A digital rectal exam revealed no abnormalities of the rectum.   The LB [id number] endoscope was introduced through the anus and advanced to the surgical anastomosis. No adverse events experienced.   The quality of the prep was excellent, using MoviPrep  The instrument was then slowly withdrawn as the colon was fully examined.  COLON FINDINGS: The surgical anastomosis was located at approximately 45 cm from the anal verge.  Just below this was a friable 2 cm mass lesion arising from the colonic mucosa.  This was biopsied multiple times.  The colonic mucosa was somewhat featureless though no significant active inflammation.  Multiple surveillance biopsies were taken according to protocol.  In  the rectum, 2 friable/fleshy polyps measuring approximately 8-10 mm were removed with hot snare and submitted for pathologic analysis. Retroflex view of the rectum revealed no additional abnormalities. Retroflexed views revealed no abnormalities. The time to cecum=minutes 0 seconds.  Withdrawal time=minutes 0 seconds.  The scope was withdrawn and the procedure completed. COMPLICATIONS: There were no complications.  ENDOSCOPIC IMPRESSION: 1. History of long-standing ulcerative colitis status post subtotal colectomy. 2. Polypoid lesions in the descending colon and rectum as described. Status post biopsies and polypectomies respectively 3. Overall colitis looks significantly improved from previous exams  RECOMMENDATIONS: 1.  Continue current medications 2.  Await pathology results   eSigned:  JEustace Quail MD 10/09/2013 1:26 PM  cc: The Patient and SKarsten RoMD   PATIENT NAME:  Matthew [name], RagoMR#: [id number]

## 2013-10-09 NOTE — Progress Notes (Signed)
Called to room to assist during endoscopic procedure.  Patient ID and intended procedure confirmed with present staff. Received instructions for my participation in the procedure from the performing physician.  

## 2013-10-09 NOTE — Patient Instructions (Addendum)
YOU HAD AN ENDOSCOPIC PROCEDURE TODAY AT [facility]: Refer to the procedure report that was given to you for any specific questions about what was found during the examination.  If the procedure report does not answer your questions, please call your gastroenterologist to clarify.  If you requested that your care partner not be given the details of your procedure findings, then the procedure report has been included in a sealed envelope for you to review at your convenience later.  YOU SHOULD EXPECT: Some feelings of bloating in the abdomen. Passage of more gas than usual.  Walking can help get rid of the air that was put into your GI tract during the procedure and reduce the bloating. If you had a lower endoscopy (such as a colonoscopy or flexible sigmoidoscopy) you may notice spotting of blood in your stool or on the toilet paper. If you underwent a bowel prep for your procedure, then you may not have a normal bowel movement for a few days.  DIET: Your first meal following the procedure should be a light meal and then it is ok to progress to your normal diet.  A half-sandwich or bowl of soup is an example of a good first meal.  Heavy or fried foods are harder to digest and may make you feel nauseous or bloated.  Likewise meals heavy in dairy and vegetables can cause extra gas to form and this can also increase the bloating.  Drink plenty of fluids but you should avoid alcoholic beverages for 24 hours.  ACTIVITY: Your care partner should take you home directly after the procedure.  You should plan to take it easy, moving slowly for the rest of the day.  You can resume normal activity the day after the procedure however you should NOT DRIVE or use heavy machinery for 24 hours (because of the sedation medicines used during the test).    SYMPTOMS TO REPORT IMMEDIATELY: A gastroenterologist can be reached at any hour.  During normal business hours, 8:30 AM to 5:00 PM Monday through Friday,  call [phone].  After hours and on weekends, please call the GI answering service at [phone] who will take a message and have the physician on call contact you.   Following lower endoscopy (colonoscopy or flexible sigmoidoscopy):  Excessive amounts of blood in the stool  Significant tenderness or worsening of abdominal pains  Swelling of the abdomen that is new, acute  Fever of 100F or higher   FOLLOW UP: If any biopsies were taken you will be contacted by phone or by letter within the next 1-3 weeks.  Call your gastroenterologist if you have not heard about the biopsies in 3 weeks.  Our staff will call the home number listed on your records the next business day following your procedure to check on you and address any questions or concerns that you may have at that time regarding the information given to you following your procedure. This is a courtesy call and so if there is no answer at the home number and we have not heard from you through the emergency physician on call, we will assume that you have returned to your regular daily activities without incident.  SIGNATURES/CONFIDENTIALITY: You and/or your care partner have signed paperwork which will be entered into your electronic medical record.  These signatures attest to the fact that that the information above on your After Visit Summary has been reviewed and is understood.  Full responsibility of the confidentiality of  this discharge information lies with you and/or your care-partner.  Polyps-handout given  Wait pathology report.

## 2013-10-09 NOTE — Progress Notes (Signed)
Patient did not experience any of the following events: a burn prior to discharge; a fall within the facility; wrong site/side/patient/procedure/implant event; or a hospital transfer or hospital admission upon discharge from the facility. (G8907) Patient did not have preoperative order for IV antibiotic SSI prophylaxis. (G8918)  

## 2013-10-10 ENCOUNTER — Telehealth: Payer: Self-pay | Admitting: *Deleted

## 2013-10-10 NOTE — Telephone Encounter (Signed)
  Follow up Call-  Call back number 10/09/2013  Post procedure Call Back phone  # [phone] hm  Permission to leave phone message No  comments no answering service     Patient questions:  Do you have a fever, pain , or abdominal swelling? no Pain Score  0 *  Have you tolerated food without any problems? yes  Have you been able to return to your normal activities? yes  Do you have any questions about your discharge instructions: Diet   no Medications  no Follow up visit  no  Do you have questions or concerns about your Care? no  Actions: * If pain score is 4 or above: No action needed, pain <4.

## 2013-10-14 ENCOUNTER — Encounter: Payer: Self-pay | Admitting: Internal Medicine

## 2013-10-25 ENCOUNTER — Other Ambulatory Visit: Payer: Self-pay | Admitting: Internal Medicine

## 2013-11-11 ENCOUNTER — Encounter (HOSPITAL_COMMUNITY)
Admission: RE | Admit: 2013-11-11 | Discharge: 2013-11-11 | Disposition: A | Discharge status: Home / Self Care | Payer: Medicare Other | Source: Ambulatory Visit | Attending: Internal Medicine | Admitting: Internal Medicine

## 2013-11-11 VITALS — BP 135/77 | HR 79 | Temp 97.6°F | Resp 18 | Ht 70.5 in | Wt 274.1 lb

## 2013-11-11 DIAGNOSIS — K5190 Ulcerative colitis, unspecified, without complications: Secondary | ICD-10-CM | POA: Insufficient documentation

## 2013-11-11 MED ORDER — ACETAMINOPHEN 325 MG PO TABS
650.0000 mg | ORAL_TABLET | Freq: Once | ORAL | Status: AC
  Administered 2013-11-11: 650 mg via ORAL
  Filled 2013-11-11: qty 2

## 2013-11-11 MED ORDER — SODIUM CHLORIDE 0.9 % IV SOLN
5.0000 mg/kg | Freq: Once | INTRAVENOUS | Status: AC
  Administered 2013-11-11: 600 mg via INTRAVENOUS
  Filled 2013-11-11: qty 60

## 2013-11-11 MED ORDER — SODIUM CHLORIDE 0.9 % IV SOLN
Freq: Once | INTRAVENOUS | Status: AC
  Administered 2013-11-11: 11:00:00 via INTRAVENOUS

## 2013-11-11 MED ORDER — DIPHENHYDRAMINE HCL 25 MG PO CAPS
50.0000 mg | ORAL_CAPSULE | Freq: Once | ORAL | Status: AC
  Administered 2013-11-11: 50 mg via ORAL
  Filled 2013-11-11: qty 2

## 2013-11-11 NOTE — Discharge Instructions (Signed)
Ulcerative Colitis Ulcerative colitis is a long lasting swelling and soreness (inflammation) of the colon (large intestine). In patients with ulcerative colitis, sores (ulcers) and inflammation of the inner lining of the colon lead to illness. Ulcerative colitis can also cause problems outside the digestive tract.  Ulcerative colitis is closely related to another condition of inflammation of the intestines called Crohn's disease. Together, they are frequently referred to as inflammatory bowel disease (IBD). Ulcerative colitis and Crohn's diseases are conditions that can last years to decades. Men and women are affected equally. They most commonly begin during adolescence and early adulthood. SYMPTOMS  Common symptoms of ulcerative colitis include rectal bleeding and diarrhea. There is a wide range of symptoms among patients with this disease depending on how severe the disease is. Some of these symptoms are:  Abdominal pain or cramping.  Diarrhea.  Fever.  Tiredness (fatigue).  Weight loss.  Night sweats.  Rectal pain.  Feeling the immediate need to have a bowel movement (rectal urgency). CAUSES  Ulcerative colitis is caused by increased activity of the immune system in the intestines. The immune system is the system that protects the body against disease such as harmful bacteria, viruses, fungi, and other foreign invaders. When the immune system overacts, it causes inflammation. The cause of the increased immune system activity is not known. This over activity causes long-lasting inflammation and ulceration. This condition may be passed down from your parents (inherited). Brothers, sisters, children, and parents of patients with IBD are more likely to develop these diseases. It is not contagious. This means you cannot catch it from someone else. DIAGNOSIS  Your caregiver may suspect ulcerative colitis based on your symptoms and exam. Blood tests may confirm that there is a problem. You may  be asked to submit a stool specimen for examination. X-rays and CT scans may be necessary. Ultimately, the diagnosis is usually made after a flexible tube is inserted via your anus and your colon is examined under sedation (colonoscopy). With this test, the specialist can take a tiny tissue sample from inside the bowel (biopsy). Examination of this biopsy tissue under a microscopy can reveal ulcerative colitis as the cause of your symptoms. TREATMENT   There is no cure for ulcerative colitis.  Complications such as massive bleeding from the colon (hemorrhage), development of a hole in the colon (perforation), or the development of precancerous or cancerous changes of the colon may require surgery.  Medications are often used to decrease inflammation and control the immune system. These include medicines related to aspirin, steroid medications, and newer and stronger medications to slow down the immune system. Some medications may be used as suppositories or enemas. A number of other medications are used or have been studied. Your caregiver will make specific recommendations. HOME CARE INSTRUCTIONS   There is no cure for ulcerative colitis disease. The best treatment is frequent checkups with your caregiver. Periodic reevaluation is important.  Symptoms such as diarrhea can be controlled with medications. Avoid foods that have a laxative effect such fresh fruit and vegetables and dairy products. During flare ups, you can rest your bowel by staying away from solid foods. Drink clear liquids frequently during the day. Electrolyte or rehydrating fluids are best. Your caregiver can help you with suggestions. Drink often to prevent dehydration. When diarrhea has cleared, eat smaller meals and more often. Avoid food additives and stimulants such as caffeine (coffee, tea, many sodas, or chocolate). Avoid dairy products. Enzyme supplements may help if you develop intolerance to  a sugar in dairy products  (lactose). Ask your caregiver or dietitian about specific dietary instructions.  If you had surgery, be sure you understand your care instructions thoroughly, including proper care of any surgical wounds.  Take any medications exactly as prescribed.  Try to maintain a positive attitude. Learn relaxation techniques such as self hypnosis, mental imaging, and muscle relaxation. If possible, avoid stresses that aggravate your condition. Exercise regularly. Follow your diet. Always get plenty of rest. SEEK MEDICAL CARE IF:   Your symptoms fail to improve after a week or two of new treatment.  You experience continued weight loss.  You have ongoing crampy digestion or loose bowels.  You develop a new skin rash, skin sores, or eye problems. SEEK IMMEDIATE MEDICAL CARE IF:   You have worsening of your symptoms or develop new symptoms.  You have an oral temperature above 102 F (38.9 C), not controlled by medicine.  You develop bloody diarrhea.  You have severe abdominal pain. Document Released: 09/20/2005 Document Revised: 03/04/2012 Document Reviewed: 08/20/2007 ExitCare Patient Information 2014 ExitCare, [place] Infliximab injection What is this medicine? INFLIXIMAB (in [place] i mab) is used to treat Crohn's disease and ulcerative colitis. It is also used to treat ankylosing spondylitis, psoriasis, and some forms of arthritis. This medicine may be used for other purposes; ask your health care provider or pharmacist if you have questions. COMMON BRAND NAME(S): Remicade What should I tell my health care provider before I take this medicine? They need to know if you have any of these conditions: -diabetes -exposure to tuberculosis -heart failure -hepatitis or liver disease -immune system problems -infection -lung or breathing disease, like COPD -multiple sclerosis -current or past resident of [place] or [place] -seizure disorder -an unusual or allergic reaction to  infliximab, mouse proteins, other medicines, foods, dyes, or preservatives -pregnant or trying to get pregnant -breast-feeding How should I use this medicine? This medicine is for injection into a vein. It is usually given by a health care professional in a hospital or clinic setting. A special MedGuide will be given to you by the pharmacist with each prescription and refill. Be sure to read this information carefully each time. Talk to your pediatrician regarding the use of this medicine in children. Special care may be needed. Overdosage: If you think you have taken too much of this medicine contact a poison control center or emergency room at once. NOTE: This medicine is only for you. Do not share this medicine with others. What if I miss a dose? It is important not to miss your dose. Call your doctor or health care professional if you are unable to keep an appointment. What may interact with this medicine? Do not take this medicine with any of the following medications: -anakinra -rilonacept This medicine may also interact with the following medications: -vaccines This list may not describe all possible interactions. Give your health care provider a list of all the medicines, herbs, non-prescription drugs, or dietary supplements you use. Also tell them if you smoke, drink alcohol, or use illegal drugs. Some items may interact with your medicine. What should I watch for while using this medicine? Visit your doctor or health care professional for regular checks on your progress. If you get a cold or other infection while receiving this medicine, call your doctor or health care professional. Do not treat yourself. This medicine may decrease your body's ability to fight infections. Before beginning therapy, your doctor may do a test to see if  you have been exposed to tuberculosis. This medicine may make the symptoms of heart failure worse in some patients. If you notice symptoms such as  increased shortness of breath or swelling of the ankles or legs, contact your health care provider right away. If you are going to have surgery or dental work, tell your health care professional or dentist that you have received this medicine. If you take this medicine for plaque psoriasis, stay out of the sun. If you cannot avoid being in the sun, wear protective clothing and use sunscreen. Do not use sun lamps or tanning beds/booths. What side effects may I notice from receiving this medicine? Side effects that you should report to your doctor or health care professional as soon as possible: -allergic reactions like skin rash, itching or hives, swelling of the face, lips, or tongue -chest pain -fever or chills, usually related to the infusion -muscle or joint pain -red, scaly patches or raised bumps on the skin -signs of infection - fever or chills, cough, sore throat, pain or difficulty passing urine -swollen lymph nodes in the neck, underarm, or groin areas -unexplained weight loss -unusual bleeding or bruising -unusually weak or tired -yellowing of the eyes or skin Side effects that usually do not require medical attention (report to your doctor or health care professional if they continue or are bothersome): -headache -heartburn or stomach pain -nausea, vomiting This list may not describe all possible side effects. Call your doctor for medical advice about side effects. You may report side effects to FDA at 1-800-FDA-1088. Where should I keep my medicine? This drug is given in a hospital or clinic and will not be stored at home. NOTE: This sheet is a summary. It may not cover all possible information. If you have questions about this medicine, talk to your doctor, pharmacist, or health care provider.  2014, Elsevier/Gold Standard. (2008-07-29 10:26:02) .

## 2013-11-26 ENCOUNTER — Other Ambulatory Visit: Payer: Self-pay | Admitting: Internal Medicine

## 2013-12-16 ENCOUNTER — Other Ambulatory Visit: Payer: Self-pay

## 2013-12-16 ENCOUNTER — Telehealth: Payer: Self-pay | Admitting: Internal Medicine

## 2013-12-16 DIAGNOSIS — K5190 Ulcerative colitis, unspecified, without complications: Secondary | ICD-10-CM

## 2013-12-16 NOTE — Telephone Encounter (Signed)
Pt is scheduled for Remicade 12/23/13. States he has a cold, no fever but a little bit of light green sinus drainage. Pt wants to know if it is ok for him to go ahead and get his Remicade. Please advise.

## 2013-12-17 NOTE — Telephone Encounter (Signed)
Hopefully, he will be feeling better by that time, in which case it would be okay. However, if he is feeling worse he should probably delay until symptoms have resolved.

## 2013-12-17 NOTE — Telephone Encounter (Signed)
Spoke with pts mother and she is aware. States they will call the office Monday if he needs to cancel his remicade.

## 2013-12-22 ENCOUNTER — Other Ambulatory Visit: Payer: Self-pay

## 2013-12-23 ENCOUNTER — Encounter (HOSPITAL_COMMUNITY)
Admission: RE | Admit: 2013-12-23 | Discharge: 2013-12-23 | Disposition: A | Discharge status: Home / Self Care | Payer: Medicare Other | Source: Ambulatory Visit | Attending: Internal Medicine | Admitting: Internal Medicine

## 2013-12-23 ENCOUNTER — Encounter (HOSPITAL_COMMUNITY): Payer: Self-pay

## 2013-12-23 VITALS — BP 135/81 | HR 93 | Temp 98.2°F | Resp 18 | Ht 70.5 in | Wt 272.0 lb

## 2013-12-23 DIAGNOSIS — K5190 Ulcerative colitis, unspecified, without complications: Secondary | ICD-10-CM

## 2013-12-23 MED ORDER — ACETAMINOPHEN 325 MG PO TABS
650.0000 mg | ORAL_TABLET | Freq: Once | ORAL | Status: AC
  Administered 2013-12-23: 650 mg via ORAL
  Filled 2013-12-23: qty 2

## 2013-12-23 MED ORDER — INFLIXIMAB 100 MG IV SOLR
5.0000 mg/kg | Freq: Once | INTRAVENOUS | Status: DC
  Filled 2013-12-23: qty 60

## 2013-12-23 MED ORDER — DIPHENHYDRAMINE HCL 25 MG PO CAPS
50.0000 mg | ORAL_CAPSULE | Freq: Once | ORAL | Status: AC
  Administered 2013-12-23: 50 mg via ORAL
  Filled 2013-12-23: qty 2

## 2013-12-23 MED ORDER — INFLIXIMAB 100 MG IV SOLR
5.0000 mg/kg | Freq: Once | INTRAVENOUS | Status: AC
  Administered 2013-12-23: 600 mg via INTRAVENOUS
  Filled 2013-12-23: qty 60

## 2013-12-23 MED ORDER — SODIUM CHLORIDE 0.9 % IV SOLN
Freq: Once | INTRAVENOUS | Status: AC
  Administered 2013-12-23: 11:00:00 via INTRAVENOUS

## 2014-01-02 ENCOUNTER — Other Ambulatory Visit: Payer: Self-pay | Admitting: Internal Medicine

## 2014-01-07 ENCOUNTER — Telehealth: Payer: Self-pay

## 2014-01-07 MED ORDER — ZOLPIDEM TARTRATE 10 MG PO TABS: 10.0000 mg | ORAL_TABLET | Freq: Every evening | ORAL | Status: DC | PRN

## 2014-01-07 NOTE — Telephone Encounter (Signed)
Message copied by Audrea Muscat on Wed Jan 07, 2014  9:42 AM ------      Message from: Irene Shipper      Created: Tue Jan 06, 2014  1:45 PM       Ok to refill. Thanks       ----- Message -----         From: Audrea Muscat, CMA         Sent: 01/06/2014  11:13 AM           To: Irene Shipper, MD            Patient requesting refill of Ambien.  Last o/v 04/24/2013.  Last refill 10/08/2013 - #30 with 2 refills.  Ok to refill?       ------

## 2014-01-07 NOTE — Telephone Encounter (Signed)
Per Dr. [name], refilled Ambien.  Called patient and told him I had faxed it to his pharmacy.  Patient acknowledged and understood.

## 2014-01-07 NOTE — Telephone Encounter (Signed)
Per Dr. [name], refilled Ambien.  Called patient to let him know

## 2014-02-02 ENCOUNTER — Other Ambulatory Visit: Payer: Self-pay

## 2014-02-02 DIAGNOSIS — K5190 Ulcerative colitis, unspecified, without complications: Secondary | ICD-10-CM

## 2014-02-02 MED ORDER — INFLIXIMAB 100 MG IV SOLR: 5.0000 mg/kg | Freq: Once | INTRAVENOUS | Status: DC

## 2014-02-02 MED ORDER — DIPHENHYDRAMINE HCL 25 MG PO TABS: 50.0000 mg | ORAL_TABLET | Freq: Once | ORAL | Status: DC

## 2014-02-02 MED ORDER — ACETAMINOPHEN 325 MG PO TABS: 650.0000 mg | ORAL_TABLET | Freq: Once | ORAL | Status: DC

## 2014-02-03 ENCOUNTER — Encounter (HOSPITAL_COMMUNITY): Payer: Self-pay

## 2014-02-03 ENCOUNTER — Encounter (HOSPITAL_COMMUNITY)
Admission: RE | Admit: 2014-02-03 | Discharge: 2014-02-03 | Disposition: A | Discharge status: Home / Self Care | Payer: Medicare Other | Source: Ambulatory Visit | Attending: Internal Medicine | Admitting: Internal Medicine

## 2014-02-03 DIAGNOSIS — K5190 Ulcerative colitis, unspecified, without complications: Secondary | ICD-10-CM | POA: Insufficient documentation

## 2014-02-03 MED ORDER — SODIUM CHLORIDE 0.9 % IV SOLN
Freq: Once | INTRAVENOUS | Status: AC
  Administered 2014-02-03: 11:00:00 via INTRAVENOUS

## 2014-02-03 MED ORDER — SODIUM CHLORIDE 0.9 % IV SOLN
5.0000 mg/kg | Freq: Once | INTRAVENOUS | Status: AC
  Administered 2014-02-03: 600 mg via INTRAVENOUS
  Filled 2014-02-03: qty 60

## 2014-02-03 MED ORDER — ACETAMINOPHEN 325 MG PO TABS
650.0000 mg | ORAL_TABLET | Freq: Once | ORAL | Status: AC
  Administered 2014-02-03: 650 mg via ORAL
  Filled 2014-02-03: qty 2

## 2014-02-03 MED ORDER — DIPHENHYDRAMINE HCL 25 MG PO CAPS
50.0000 mg | ORAL_CAPSULE | Freq: Once | ORAL | Status: AC
  Administered 2014-02-03: 50 mg via ORAL
  Filled 2014-02-03: qty 2

## 2014-03-09 ENCOUNTER — Telehealth: Payer: Self-pay | Admitting: Internal Medicine

## 2014-03-09 MED ORDER — MESALAMINE ER 500 MG PO CPCR: ORAL_CAPSULE | ORAL | Status: DC

## 2014-03-09 NOTE — Telephone Encounter (Signed)
Refilled Pentasa

## 2014-03-16 ENCOUNTER — Other Ambulatory Visit: Payer: Self-pay

## 2014-03-16 DIAGNOSIS — K5190 Ulcerative colitis, unspecified, without complications: Secondary | ICD-10-CM

## 2014-03-17 ENCOUNTER — Encounter (HOSPITAL_COMMUNITY)
Admission: RE | Admit: 2014-03-17 | Discharge: 2014-03-17 | Disposition: A | Discharge status: Home / Self Care | Payer: Medicare Other | Source: Ambulatory Visit | Attending: Internal Medicine | Admitting: Internal Medicine

## 2014-03-17 ENCOUNTER — Encounter (HOSPITAL_COMMUNITY): Payer: Self-pay

## 2014-03-17 VITALS — BP 134/82 | HR 82 | Temp 97.5°F | Resp 18 | Ht 70.5 in | Wt 277.2 lb

## 2014-03-17 DIAGNOSIS — K5190 Ulcerative colitis, unspecified, without complications: Secondary | ICD-10-CM | POA: Insufficient documentation

## 2014-03-17 MED ORDER — ACETAMINOPHEN 325 MG PO TABS
650.0000 mg | ORAL_TABLET | Freq: Once | ORAL | Status: AC
  Administered 2014-03-17: 650 mg via ORAL
  Filled 2014-03-17: qty 2

## 2014-03-17 MED ORDER — DIPHENHYDRAMINE HCL 50 MG PO CAPS
50.0000 mg | ORAL_CAPSULE | Freq: Once | ORAL | Status: AC
  Administered 2014-03-17: 50 mg via ORAL
  Filled 2014-03-17: qty 1

## 2014-03-17 MED ORDER — SODIUM CHLORIDE 0.9 % IV SOLN
5.0000 mg/kg | Freq: Once | INTRAVENOUS | Status: AC
  Administered 2014-03-17: 600 mg via INTRAVENOUS
  Filled 2014-03-17: qty 60

## 2014-03-17 MED ORDER — SODIUM CHLORIDE 0.9 % IV SOLN
INTRAVENOUS | Status: DC
  Administered 2014-03-17: 11:00:00 via INTRAVENOUS

## 2014-03-31 ENCOUNTER — Other Ambulatory Visit: Payer: Self-pay | Admitting: Internal Medicine

## 2014-04-01 ENCOUNTER — Telehealth: Payer: Self-pay

## 2014-04-01 MED ORDER — ZOLPIDEM TARTRATE 10 MG PO TABS: 10.0000 mg | ORAL_TABLET | Freq: Every evening | ORAL | Status: DC | PRN

## 2014-04-01 NOTE — Telephone Encounter (Signed)
Message copied by Audrea Muscat on Wed Apr 01, 2014 10:25 AM ------      Message from: Irene Shipper      Created: Wed Apr 01, 2014 10:21 AM       Okay to refill      ----- Message -----         From: Audrea Muscat, CMA         Sent: 04/01/2014  10:10 AM           To: Irene Shipper, MD            Received refill request of Ambien 28m 1 qd HS.  Patient originally got #30 with 2 refills on 01/07/2014.  Ok to refill?       ------

## 2014-04-01 NOTE — Telephone Encounter (Signed)
Sent message to Dr. [name] for permission to refill Ambien.  Will call patient when he responds.

## 2014-04-01 NOTE — Telephone Encounter (Signed)
Faxed Ambien to pharmacy per Dr. [name]

## 2014-04-23 DIAGNOSIS — H2700 Aphakia, unspecified eye: Secondary | ICD-10-CM | POA: Insufficient documentation

## 2014-04-24 ENCOUNTER — Other Ambulatory Visit: Payer: Self-pay

## 2014-04-24 DIAGNOSIS — K5190 Ulcerative colitis, unspecified, without complications: Secondary | ICD-10-CM

## 2014-04-28 ENCOUNTER — Other Ambulatory Visit (HOSPITAL_COMMUNITY): Payer: Self-pay | Admitting: Internal Medicine

## 2014-04-28 ENCOUNTER — Encounter (HOSPITAL_COMMUNITY): Payer: Self-pay

## 2014-04-28 ENCOUNTER — Encounter (HOSPITAL_COMMUNITY)
Admission: RE | Admit: 2014-04-28 | Discharge: 2014-04-28 | Disposition: A | Discharge status: Home / Self Care | Payer: Medicare Other | Source: Ambulatory Visit | Attending: Internal Medicine | Admitting: Internal Medicine

## 2014-04-28 VITALS — BP 133/77 | HR 91 | Temp 98.6°F | Resp 20

## 2014-04-28 DIAGNOSIS — K5190 Ulcerative colitis, unspecified, without complications: Secondary | ICD-10-CM

## 2014-04-28 MED ORDER — SODIUM CHLORIDE 0.9 % IV SOLN
Freq: Once | INTRAVENOUS | Status: AC
  Administered 2014-04-28: 10:00:00 via INTRAVENOUS

## 2014-04-28 MED ORDER — ACETAMINOPHEN 325 MG PO TABS
650.0000 mg | ORAL_TABLET | Freq: Once | ORAL | Status: AC
  Administered 2014-04-28: 650 mg via ORAL
  Filled 2014-04-28: qty 2

## 2014-04-28 MED ORDER — SODIUM CHLORIDE 0.9 % IV SOLN
600.0000 mg | Freq: Once | INTRAVENOUS | Status: AC
  Administered 2014-04-28: 600 mg via INTRAVENOUS
  Filled 2014-04-28: qty 60

## 2014-04-28 MED ORDER — INFLIXIMAB 100 MG IV SOLR
5.0000 mg/kg | Freq: Once | INTRAVENOUS | Status: DC
  Filled 2014-04-28: qty 60

## 2014-04-28 MED ORDER — DIPHENHYDRAMINE HCL 25 MG PO CAPS
50.0000 mg | ORAL_CAPSULE | Freq: Once | ORAL | Status: AC
  Administered 2014-04-28: 50 mg via ORAL
  Filled 2014-04-28: qty 2

## 2014-05-21 ENCOUNTER — Other Ambulatory Visit: Payer: Self-pay | Admitting: Internal Medicine

## 2014-06-08 ENCOUNTER — Other Ambulatory Visit: Payer: Self-pay

## 2014-06-08 DIAGNOSIS — K5190 Ulcerative colitis, unspecified, without complications: Secondary | ICD-10-CM

## 2014-06-08 MED ORDER — DIPHENHYDRAMINE HCL 25 MG PO CAPS: 50.0000 mg | ORAL_CAPSULE | Freq: Once | ORAL | Status: DC

## 2014-06-08 MED ORDER — INFLIXIMAB 100 MG IV SOLR: 5.0000 mg/kg | Freq: Once | INTRAVENOUS | Status: DC

## 2014-06-08 MED ORDER — ACETAMINOPHEN 325 MG PO TABS: 650.0000 mg | ORAL_TABLET | Freq: Once | ORAL | Status: DC

## 2014-06-09 ENCOUNTER — Other Ambulatory Visit: Payer: Self-pay

## 2014-06-09 ENCOUNTER — Encounter (HOSPITAL_COMMUNITY)
Admission: RE | Admit: 2014-06-09 | Discharge: 2014-06-09 | Disposition: A | Discharge status: Home / Self Care | Payer: Medicare Other | Source: Ambulatory Visit | Attending: Internal Medicine | Admitting: Internal Medicine

## 2014-06-09 ENCOUNTER — Encounter (HOSPITAL_COMMUNITY): Payer: Self-pay

## 2014-06-09 VITALS — BP 139/83 | HR 81 | Temp 97.8°F | Resp 20 | Ht 70.5 in | Wt 277.2 lb

## 2014-06-09 DIAGNOSIS — K5190 Ulcerative colitis, unspecified, without complications: Secondary | ICD-10-CM

## 2014-06-09 MED ORDER — SODIUM CHLORIDE 0.9 % IV SOLN
Freq: Once | INTRAVENOUS | Status: AC
  Administered 2014-06-09: 11:00:00 via INTRAVENOUS

## 2014-06-09 MED ORDER — ACETAMINOPHEN 325 MG PO TABS
650.0000 mg | ORAL_TABLET | Freq: Once | ORAL | Status: AC
  Administered 2014-06-09: 650 mg via ORAL
  Filled 2014-06-09: qty 2

## 2014-06-09 MED ORDER — INFLIXIMAB 100 MG IV SOLR
5.0000 mg/kg | Freq: Once | INTRAVENOUS | Status: AC
  Administered 2014-06-09: 600 mg via INTRAVENOUS
  Filled 2014-06-09: qty 60

## 2014-06-09 MED ORDER — DIPHENHYDRAMINE HCL 25 MG PO CAPS
50.0000 mg | ORAL_CAPSULE | Freq: Once | ORAL | Status: AC
  Administered 2014-06-09: 50 mg via ORAL
  Filled 2014-06-09 (×2): qty 2

## 2014-06-09 MED ORDER — SODIUM CHLORIDE 0.9 % IV SOLN
600.0000 mg | Freq: Once | INTRAVENOUS | Status: AC
  Administered 2014-06-09: 600 mg via INTRAVENOUS
  Filled 2014-06-09: qty 60

## 2014-06-09 NOTE — Progress Notes (Signed)
See MAR. REMICADE was infused per protocol at at ramped up schedule increasing every 15 minutes. Pt received the 615m total dose of REMICADE as ordered

## 2014-06-23 ENCOUNTER — Other Ambulatory Visit (HOSPITAL_COMMUNITY): Payer: Self-pay | Admitting: Internal Medicine

## 2014-06-23 MED ORDER — ZOLPIDEM TARTRATE 10 MG PO TABS: 10.0000 mg | ORAL_TABLET | Freq: Every evening | ORAL | Status: DC | PRN

## 2014-06-23 NOTE — Telephone Encounter (Signed)
Patient requesting refill of Ambien.  Last filled 03-31-2014 #30 with one refill.  Ok to refill?

## 2014-06-23 NOTE — Telephone Encounter (Signed)
Ok to refill 

## 2014-06-30 ENCOUNTER — Telehealth: Payer: Self-pay

## 2014-06-30 MED ORDER — MESALAMINE ER 500 MG PO CPCR: ORAL_CAPSULE | ORAL | Status: DC

## 2014-06-30 NOTE — Telephone Encounter (Signed)
Refilled Mesalamine

## 2014-07-06 ENCOUNTER — Telehealth: Payer: Self-pay

## 2014-07-06 MED ORDER — MESALAMINE ER 500 MG PO CPCR: ORAL_CAPSULE | ORAL | Status: DC

## 2014-07-06 NOTE — Telephone Encounter (Signed)
Refilled Pentasa

## 2014-07-15 ENCOUNTER — Other Ambulatory Visit: Payer: Self-pay

## 2014-07-15 DIAGNOSIS — K5190 Ulcerative colitis, unspecified, without complications: Secondary | ICD-10-CM

## 2014-07-15 MED ORDER — DIPHENHYDRAMINE HCL 25 MG PO TABS: 50.0000 mg | ORAL_TABLET | Freq: Once | ORAL | Status: DC

## 2014-07-15 MED ORDER — INFLIXIMAB 100 MG IV SOLR: 5.0000 mg/kg | Freq: Once | INTRAVENOUS | Status: DC

## 2014-07-15 MED ORDER — ACETAMINOPHEN 325 MG PO TABS: 650.0000 mg | ORAL_TABLET | Freq: Once | ORAL | Status: DC

## 2014-07-17 ENCOUNTER — Telehealth: Payer: Self-pay | Admitting: Internal Medicine

## 2014-07-17 NOTE — Telephone Encounter (Signed)
Patient is on Remicade and is due for PPD in August.  He is scheduled for PPD skin test on 07/29/14 2:00

## 2014-07-20 ENCOUNTER — Other Ambulatory Visit: Payer: Self-pay

## 2014-07-20 DIAGNOSIS — K5120 Ulcerative (chronic) proctitis without complications: Secondary | ICD-10-CM

## 2014-07-21 ENCOUNTER — Encounter (HOSPITAL_COMMUNITY)
Admission: RE | Admit: 2014-07-21 | Discharge: 2014-07-21 | Disposition: A | Discharge status: Home / Self Care | Payer: Medicare Other | Source: Ambulatory Visit | Attending: Internal Medicine | Admitting: Internal Medicine

## 2014-07-21 ENCOUNTER — Encounter (HOSPITAL_COMMUNITY): Payer: Self-pay

## 2014-07-21 VITALS — BP 134/78 | HR 85 | Temp 97.2°F | Resp 20 | Ht 70.5 in | Wt 280.1 lb

## 2014-07-21 DIAGNOSIS — K5120 Ulcerative (chronic) proctitis without complications: Secondary | ICD-10-CM | POA: Diagnosis present

## 2014-07-21 MED ORDER — ACETAMINOPHEN 325 MG PO TABS
650.0000 mg | ORAL_TABLET | Freq: Once | ORAL | Status: AC
  Administered 2014-07-21: 650 mg via ORAL
  Filled 2014-07-21: qty 2

## 2014-07-21 MED ORDER — DIPHENHYDRAMINE HCL 25 MG PO CAPS
50.0000 mg | ORAL_CAPSULE | Freq: Once | ORAL | Status: AC
  Administered 2014-07-21: 50 mg via ORAL
  Filled 2014-07-21: qty 2

## 2014-07-21 MED ORDER — SODIUM CHLORIDE 0.9 % IV SOLN
Freq: Once | INTRAVENOUS | Status: AC
  Administered 2014-07-21: 250 mL via INTRAVENOUS

## 2014-07-21 MED ORDER — SODIUM CHLORIDE 0.9 % IV SOLN
5.0000 mg/kg | Freq: Once | INTRAVENOUS | Status: AC
  Administered 2014-07-21: 600 mg via INTRAVENOUS
  Filled 2014-07-21: qty 60

## 2014-07-21 MED ORDER — INFLIXIMAB 100 MG IV SOLR: 5.0000 mg/kg | Freq: Once | INTRAVENOUS | Status: DC

## 2014-07-21 NOTE — Progress Notes (Signed)
Pt here for REMICADE infusion at the ramp up( 10cc/hr, 20cc/hr, 40cc/hr 80cc/hr,every15 minutes; the 150cc/hr and 250cc/hr every 30 minutes) schedule of infusion of 68m over 2 hours. Last ramp up was for 250cc/hr x 30 minutes. At the end of the infusion I noticed that patient was flushed. VSS and he said it only lasted a few minutes but he did not call the nurse. After ambulating to BR he stated he felt fine. For next infusion we will stop the ramp [place]dule at 150cc/hr as the maximum..Marland Kitchen

## 2014-07-21 NOTE — Discharge Instructions (Signed)
Infliximab injection What is this medicine? INFLIXIMAB (in [place] i mab) is used to treat Crohn's disease and ulcerative colitis. It is also used to treat ankylosing spondylitis, psoriasis, and some forms of arthritis. This medicine may be used for other purposes; ask your health care provider or pharmacist if you have questions. COMMON BRAND NAME(S): Remicade What should I tell my health care provider before I take this medicine? They need to know if you have any of these conditions: -diabetes -exposure to tuberculosis -heart failure -hepatitis or liver disease -immune system problems -infection -lung or breathing disease, like COPD -multiple sclerosis -current or past resident of [place] or [place] -seizure disorder -an unusual or allergic reaction to infliximab, mouse proteins, other medicines, foods, dyes, or preservatives -pregnant or trying to get pregnant -breast-feeding How should I use this medicine? This medicine is for injection into a vein. It is usually given by a health care professional in a hospital or clinic setting. A special MedGuide will be given to you by the pharmacist with each prescription and refill. Be sure to read this information carefully each time. Talk to your pediatrician regarding the use of this medicine in children. Special care may be needed. Overdosage: If you think you have taken too much of this medicine contact a poison control center or emergency room at once. NOTE: This medicine is only for you. Do not share this medicine with others. What if I miss a dose? It is important not to miss your dose. Call your doctor or health care professional if you are unable to keep an appointment. What may interact with this medicine? Do not take this medicine with any of the following medications: -anakinra -rilonacept This medicine may also interact with the following medications: -vaccines This list may not describe all possible interactions.  Give your health care provider a list of all the medicines, herbs, non-prescription drugs, or dietary supplements you use. Also tell them if you smoke, drink alcohol, or use illegal drugs. Some items may interact with your medicine. What should I watch for while using this medicine? Visit your doctor or health care professional for regular checks on your progress. If you get a cold or other infection while receiving this medicine, call your doctor or health care professional. Do not treat yourself. This medicine may decrease your body's ability to fight infections. Before beginning therapy, your doctor may do a test to see if you have been exposed to tuberculosis. This medicine may make the symptoms of heart failure worse in some patients. If you notice symptoms such as increased shortness of breath or swelling of the ankles or legs, contact your health care provider right away. If you are going to have surgery or dental work, tell your health care professional or dentist that you have received this medicine. If you take this medicine for plaque psoriasis, stay out of the sun. If you cannot avoid being in the sun, wear protective clothing and use sunscreen. Do not use sun lamps or tanning beds/booths. What side effects may I notice from receiving this medicine? Side effects that you should report to your doctor or health care professional as soon as possible: -allergic reactions like skin rash, itching or hives, swelling of the face, lips, or tongue -chest pain -fever or chills, usually related to the infusion -muscle or joint pain -red, scaly patches or raised bumps on the skin -signs of infection - fever or chills, cough, sore throat, pain or difficulty passing urine -swollen lymph nodes  in the neck, underarm, or groin areas -unexplained weight loss -unusual bleeding or bruising -unusually weak or tired -yellowing of the eyes or skin Side effects that usually do not require medical attention  (report to your doctor or health care professional if they continue or are bothersome): -headache -heartburn or stomach pain -nausea, vomiting This list may not describe all possible side effects. Call your doctor for medical advice about side effects. You may report side effects to FDA at 1-800-FDA-1088. Where should I keep my medicine? This drug is given in a hospital or clinic and will not be stored at home. NOTE: This sheet is a summary. It may not cover all possible information. If you have questions about this medicine, talk to your doctor, pharmacist, or health care provider.  2015, Elsevier/Gold Standard. (2008-07-29 10:26:02)  Ulcerative Colitis Ulcerative colitis is a long lasting swelling and soreness (inflammation) of the colon (large intestine). In patients with ulcerative colitis, sores (ulcers) and inflammation of the inner lining of the colon lead to illness. Ulcerative colitis can also cause problems outside the digestive tract.  Ulcerative colitis is closely related to another condition of inflammation of the intestines called Crohn's disease. Together, they are frequently referred to as inflammatory bowel disease (IBD). Ulcerative colitis and Crohn's diseases are conditions that can last years to decades. Men and women are affected equally. They most commonly begin during adolescence and early adulthood. SYMPTOMS  Common symptoms of ulcerative colitis include rectal bleeding and diarrhea. There is a wide range of symptoms among patients with this disease depending on how severe the disease is. Some of these symptoms are:  Abdominal pain or cramping.  Diarrhea.  Fever.  Tiredness (fatigue).  Weight loss.  Night sweats.  Rectal pain.  Feeling the immediate need to have a bowel movement (rectal urgency). CAUSES  Ulcerative colitis is caused by increased activity of the immune system in the intestines. The immune system is the system that protects the body against  disease such as harmful bacteria, viruses, fungi, and other foreign invaders. When the immune system overacts, it causes inflammation. The cause of the increased immune system activity is not known. This over activity causes long-lasting inflammation and ulceration. This condition may be passed down from your parents (inherited). Brothers, sisters, children, and parents of patients with IBD are more likely to develop these diseases. It is not contagious. This means you cannot catch it from someone else. DIAGNOSIS  Your caregiver may suspect ulcerative colitis based on your symptoms and exam. Blood tests may confirm that there is a problem. You may be asked to submit a stool specimen for examination. X-rays and CT scans may be necessary. Ultimately, the diagnosis is usually made after a flexible tube is inserted via your anus and your colon is examined under sedation (colonoscopy). With this test, the specialist can take a tiny tissue sample from inside the bowel (biopsy). Examination of this biopsy tissue under a microscopy can reveal ulcerative colitis as the cause of your symptoms. TREATMENT   There is no cure for ulcerative colitis.  Complications such as massive bleeding from the colon (hemorrhage), development of a hole in the colon (perforation), or the development of precancerous or cancerous changes of the colon may require surgery.  Medications are often used to decrease inflammation and control the immune system. These include medicines related to aspirin, steroid medications, and newer and stronger medications to slow down the immune system. Some medications may be used as suppositories or enemas. A number of  other medications are used or have been studied. Your caregiver will make specific recommendations. HOME CARE INSTRUCTIONS   There is no cure for ulcerative colitis disease. The best treatment is frequent checkups with your caregiver. Periodic reevaluation is important.  Symptoms such  as diarrhea can be controlled with medications. Avoid foods that have a laxative effect such fresh fruit and vegetables and dairy products. During flare ups, you can rest your bowel by staying away from solid foods. Drink clear liquids frequently during the day. Electrolyte or rehydrating fluids are best. Your caregiver can help you with suggestions. Drink often to prevent dehydration. When diarrhea has cleared, eat smaller meals and more often. Avoid food additives and stimulants such as caffeine (coffee, tea, many sodas, or chocolate). Avoid dairy products. Enzyme supplements may help if you develop intolerance to a sugar in dairy products (lactose). Ask your caregiver or dietitian about specific dietary instructions.  If you had surgery, be sure you understand your care instructions thoroughly, including proper care of any surgical wounds.  Take any medications exactly as prescribed.  Try to maintain a positive attitude. Learn relaxation techniques such as self hypnosis, mental imaging, and muscle relaxation. If possible, avoid stresses that aggravate your condition. Exercise regularly. Follow your diet. Always get plenty of rest. SEEK MEDICAL CARE IF:   Your symptoms fail to improve after a week or two of new treatment.  You experience continued weight loss.  You have ongoing crampy digestion or loose bowels.  You develop a new skin rash, skin sores, or eye problems. SEEK IMMEDIATE MEDICAL CARE IF:   You have worsening of your symptoms or develop new symptoms.  You have an oral temperature above 102 F (38.9 C), not controlled by medicine.  You develop bloody diarrhea.  You have severe abdominal pain. Document Released: 09/20/2005 Document Revised: 03/04/2012 Document Reviewed: 08/20/2007 [facility] Patient Information 2015 [place], [place]. This information is not intended to replace advice given to you by your health care provider. Make sure you discuss any questions you have with your  health care provider.

## 2014-07-29 ENCOUNTER — Ambulatory Visit (INDEPENDENT_AMBULATORY_CARE_PROVIDER_SITE_OTHER): Payer: Medicare Other | Admitting: Internal Medicine

## 2014-07-29 DIAGNOSIS — K51919 Ulcerative colitis, unspecified with unspecified complications: Secondary | ICD-10-CM

## 2014-07-29 DIAGNOSIS — K5190 Ulcerative colitis, unspecified, without complications: Secondary | ICD-10-CM

## 2014-07-29 MED ORDER — TUBERCULIN PPD 5 UNIT/0.1ML ID SOLN: 5.0000 [IU] | Freq: Once | INTRADERMAL | Status: DC

## 2014-07-31 LAB — READ PPD: TB Skin Test: NEGATIVE

## 2014-07-31 NOTE — Addendum Note (Signed)
Addended by: Marzella Schlein on: 07/31/2014 02:23 PM   Modules accepted: Orders

## 2014-08-20 ENCOUNTER — Telehealth: Payer: Self-pay

## 2014-08-20 NOTE — Telephone Encounter (Signed)
Scripts sent in for [employer] and maintenance dose.

## 2014-08-20 NOTE — Telephone Encounter (Signed)
Spoke with pts mother and she is aware that they will be contacted by [facility] to set up shipment and the nurse ambassador for injection training. Drug should ship 08/31/14. Pts old appts for Remicade cancelled at [facility].

## 2014-08-20 NOTE — Telephone Encounter (Signed)
Message copied by Algernon Huxley on Thu Aug 20, 2014  9:59 AM ------      Message from: Irene Shipper      Created: Thu Aug 20, 2014  9:34 AM      Regarding: RE: Remicade       Yes . Thanks       ----- Message -----         From: Maury Dus, RN         Sent: 08/20/2014   9:33 AM           To: Irene Shipper, MD      Subject: RE: Remicade                                             Ok. His next Remicade treatment was due 09/01/14. Should he wait until that time to start the Humira?            ----- Message -----         From: Irene Shipper, MD         Sent: 08/20/2014   9:16 AM           To: Maury Dus, RN      Subject: RE: Remicade                                             Yes. Explain to them what's involved. Thanks       ----- Message -----         From: Maury Dus, RN         Sent: 08/20/2014   9:06 AM           To: Irene Shipper, MD      Subject: RE: Remicade                                             Dr. [name],            I just found out that Beverely Low can get Humira and he has been approved for a zero copay. This would work great for him because he would be left with no other bills. Please let me know if you want to proceed with this.            Thanks,      Vaughan Basta            ----- Message -----         From: Irene Shipper, MD         Sent: 08/18/2014  11:32 AM           To: Maury Dus, RN      Subject: RE: Remicade                                             Okay. Great. Keep me posted      ----- Message -----         From: Susy Manor  Hunt, RN         Sent: 08/18/2014  11:18 AM           To: Irene Shipper, MD      Subject: RE: Remicade                                             I need to look into it for them but is sounds like he might be able to get it through a specialty pharmacy and not have to pay an administration fee.      ----- Message -----         From: Irene Shipper, MD         Sent: 08/18/2014  11:01 AM           To: Maury Dus, RN  Subject: RE: Remicade                                             Is that an affordable option for them?      ----- Message -----         From: Maury Dus, RN         Sent: 08/18/2014  10:18 AM           To: Irene Shipper, MD      Subject: Remicade                                                 Dr. [name],            I spoke with Mrs. Grandville Silos and Avondre was denied for pt assistance for Remicade since he now had medicare. Pts mother states that the price he has had to pay for infusions has continued to go up and he cannot afford this now.            Mother mentioned interest in Humira. Please advise.            Thanks,      Vaughan Basta                                           ------

## 2014-08-24 ENCOUNTER — Telehealth: Payer: Self-pay | Admitting: Internal Medicine

## 2014-08-24 NOTE — Telephone Encounter (Signed)
Pharmacy is [facility] and their number is [phone]. Attempted to call pt back but no answer, will try later.  Pt aware.

## 2014-08-25 ENCOUNTER — Other Ambulatory Visit: Payer: Self-pay | Admitting: Internal Medicine

## 2014-09-01 ENCOUNTER — Encounter (HOSPITAL_COMMUNITY): Payer: Medicare Other

## 2014-09-24 ENCOUNTER — Other Ambulatory Visit: Payer: Self-pay | Admitting: Internal Medicine

## 2014-09-28 ENCOUNTER — Encounter: Payer: Self-pay | Admitting: Internal Medicine

## 2014-09-28 ENCOUNTER — Telehealth: Payer: Self-pay

## 2014-09-28 MED ORDER — ZOLPIDEM TARTRATE 10 MG PO TABS: 10.0000 mg | ORAL_TABLET | Freq: Every evening | ORAL | Status: DC | PRN

## 2014-09-28 NOTE — Telephone Encounter (Signed)
Message copied by Audrea Muscat on Mon Sep 28, 2014 11:28 AM ------      Message from: Irene Shipper      Created: Fri Sep 25, 2014  2:18 PM       Yes. Thanks       ----- Message -----         From: Audrea Muscat, CMA         Sent: 09/25/2014   1:38 PM           To: Irene Shipper, MD            Patient requesting refill of Ambien.  Last filled 06/23/2014 71m #30 x 2                  Ok to refill?       ------

## 2014-09-28 NOTE — Telephone Encounter (Signed)
Faxed Ambien refill per Dr. [name]

## 2014-10-13 ENCOUNTER — Encounter (HOSPITAL_COMMUNITY): Payer: Medicare Other

## 2014-10-27 ENCOUNTER — Other Ambulatory Visit: Payer: Self-pay | Admitting: Internal Medicine

## 2014-10-28 ENCOUNTER — Telehealth: Payer: Self-pay | Admitting: Internal Medicine

## 2014-10-28 MED ORDER — MESALAMINE 4 G RE ENEM: 4.0000 g | ENEMA | Freq: Every day | RECTAL | Status: DC

## 2014-10-28 NOTE — Telephone Encounter (Signed)
Refilled Rowasa Enema

## 2014-11-25 ENCOUNTER — Other Ambulatory Visit: Payer: Self-pay | Admitting: Internal Medicine

## 2014-11-25 ENCOUNTER — Encounter (HOSPITAL_COMMUNITY): Payer: Medicare Other

## 2014-12-07 ENCOUNTER — Telehealth: Payer: Self-pay | Admitting: Internal Medicine

## 2014-12-07 NOTE — Telephone Encounter (Signed)
Spoke with pt and he is aware.

## 2014-12-07 NOTE — Telephone Encounter (Signed)
If he is feeling better by then, fine. Otherwise, if he needs to hold for a few days okay

## 2014-12-07 NOTE — Telephone Encounter (Signed)
Pt states that he went to a party yesterday and had vomiting and diarrhea last night. Today he still has a little bit of diarrhea. Pt is due to take his Humira on Thursday and wants to know if he should go ahead with this dose. Please advise.

## 2014-12-17 ENCOUNTER — Ambulatory Visit (INDEPENDENT_AMBULATORY_CARE_PROVIDER_SITE_OTHER): Payer: Medicare Other | Admitting: Internal Medicine

## 2014-12-17 ENCOUNTER — Encounter: Payer: Self-pay | Admitting: Internal Medicine

## 2014-12-17 VITALS — BP 136/86 | HR 96 | Ht 70.5 in | Wt 274.2 lb

## 2014-12-17 DIAGNOSIS — K5190 Ulcerative colitis, unspecified, without complications: Secondary | ICD-10-CM

## 2014-12-17 NOTE — Patient Instructions (Signed)
Continue your Humira and follow up with Dr. [name] in 6 months.

## 2014-12-17 NOTE — Progress Notes (Signed)
HISTORY OF PRESENT ILLNESS:  Matthew [name] is a 49 y.o. 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 He is intolerant to immunomodulators. His colitis is being treated with mesalamine and anti-TNF Biologics. He had been on Remicade and doing well until this summer when coverage was denied. He was switched to Humira and August, as a more affordable option. He is doing well with his injections. 2 weeks ago he reports having problems with nausea vomiting and diarrhea. Some abdominal discomfort. He thinks he may have picked up a bug and a family birthday party. For the past several months he has noticed some intermittent bleeding, loose stools, and abdominal discomfort. He feels that maybe he is not doing quite as well as he was on Remicade. No fevers or other extraintestinal complaints  REVIEW OF SYSTEMS:  All non-GI ROS negative except for arthritis, back pain, visual change, itching, sleeping problems, ankle edema  Past Medical History  Diagnosis Date  . Ulcerative colitis, unspecified   . Glaucoma   . Primary generalized (osteo)arthritis   . Inflammatory bowel disease   . Colon polyps     2007    Past Surgical History  Procedure Laterality Date  . Ventral hernia repair  2002  . Colon resection  2001  . Eye surgery    . Cornea transplant both eyes Bilateral     left, 2004; right 2005  . Glaucoma surgery Left 2014    Social History Matthew [name]  reports that he has never smoked. He has never used smokeless tobacco. He reports that he does not drink alcohol or use illicit drugs.  family history includes Breast cancer in his cousin; Colitis in his father; Colon cancer in his maternal grandfather; Colon polyps in his father and mother; Heart disease in his father and paternal grandmother; Liver cancer in his maternal grandfather;  Prostate cancer in his cousin. There is no history of Esophageal cancer, Rectal cancer, or Stomach cancer.  Allergies  Allergen Reactions  . Contrast Media [Iodinated Diagnostic Agents] Hives  . Dimetapp Allergy Sinus [Brompheniramine-Ppa-Apap] Hives       PHYSICAL EXAMINATION: Vital signs: BP 136/86 mmHg  Pulse 96  Ht 5' 10.5" (1.791 m)  Wt 274 lb 4 oz (124.399 kg)  BMI 38.78 kg/m2 General: Well-developed, well-nourished, no acute distress HEENT: Right Sclerae are anicteric, conjunctiva pink. Left eye injected with irregular pupil (chronic and unchanged) Oral mucosa intact Lungs: Clear Heart: Regular Abdomen: soft, nontender, nondistended, no obvious ascites, no peritoneal signs, normal bowel sounds. No organomegaly. Multiple surgical incisions well-healed Extremities: No edema Psychiatric: alert and oriented x3. Cooperative   ASSESSMENT:  #1. Indeterminate colitis status post subtotal colectomy. Last colonoscopy October 2014 with less active colitis and hyperplastic polyps. Switch to Humira 4 months ago, from Remicade, for insurance reasons. Recently with some abdominal complaints as described, but overall stable   PLAN:  #1. Continue mesalamine and Humira #2. Close observation for what might be a flare of disease. If so, may benefit from a short course of steroids #3. Medical note to be excused from jury duty, provided #4. Routine follow-up 6 months. Sooner if needed. Surveillance colonoscopy around October 2016

## 2014-12-22 ENCOUNTER — Other Ambulatory Visit: Payer: Self-pay | Admitting: Internal Medicine

## 2014-12-22 NOTE — Telephone Encounter (Signed)
Sent staff message to Dr. [name] asking if ok to refill Ambien, which was requested through patient's pharmacy.  Awaiting response.

## 2014-12-23 NOTE — Telephone Encounter (Signed)
-----   Message from Irene Shipper, MD sent at 12/22/2014  6:16 PM EST ----- ok ----- Message -----    From: Audrea Muscat, CMA    Sent: 12/22/2014   4:24 PM      To: Irene Shipper, MD  Patient requesting refill of Ambien - last filled 10/08/2014 #30x2.  Ok to refill?

## 2014-12-23 NOTE — Telephone Encounter (Signed)
Rx sent 

## 2015-02-22 ENCOUNTER — Other Ambulatory Visit: Payer: Self-pay | Admitting: Internal Medicine

## 2015-02-24 ENCOUNTER — Other Ambulatory Visit: Payer: Self-pay | Admitting: Internal Medicine

## 2015-02-24 NOTE — Telephone Encounter (Signed)
Patient requesting refill of Ambien.  Last filled 09/28/2014 #30 x 1.  Last office visit 12/17/2014.  Ok to refill?

## 2015-02-25 ENCOUNTER — Other Ambulatory Visit: Payer: Self-pay | Admitting: Internal Medicine

## 2015-03-31 ENCOUNTER — Other Ambulatory Visit: Payer: Self-pay | Admitting: Internal Medicine

## 2015-04-01 ENCOUNTER — Other Ambulatory Visit: Payer: Self-pay | Admitting: Internal Medicine

## 2015-04-06 ENCOUNTER — Telehealth: Payer: Self-pay

## 2015-04-06 MED ORDER — ZOLPIDEM TARTRATE 10 MG PO TABS: ORAL_TABLET | ORAL | Status: DC

## 2015-04-06 NOTE — Telephone Encounter (Signed)
Refilled Ambien per Dr. [name], faxed to [facility]

## 2015-04-06 NOTE — Telephone Encounter (Signed)
-----   Message from Irene Shipper, MD sent at 04/06/2015 10:22 AM EDT ----- OK to refill ----- Message -----    From: Audrea Muscat, CMA    Sent: 04/06/2015   8:40 AM      To: Irene Shipper, MD  Patient requesting refill of Ambien.  Last filled 02/09/2015 #30 x 0.  Ok to refill?

## 2015-04-07 ENCOUNTER — Other Ambulatory Visit: Payer: Self-pay | Admitting: Internal Medicine

## 2015-04-14 ENCOUNTER — Telehealth: Payer: Self-pay | Admitting: *Deleted

## 2015-04-14 NOTE — Telephone Encounter (Signed)
Spoke with patient and he is calling because he has a cold. His drainage is clear, denies cough or fever. He is due for a Humira injection tomorrow and wants to be sure it is ok to take it. Please, advise.

## 2015-04-14 NOTE — Telephone Encounter (Signed)
OK to proceed. Thank him for checking

## 2015-04-14 NOTE — Telephone Encounter (Signed)
Patient notified of recommendation.

## 2015-04-26 ENCOUNTER — Other Ambulatory Visit: Payer: Self-pay | Admitting: Internal Medicine

## 2015-05-06 ENCOUNTER — Other Ambulatory Visit: Payer: Self-pay | Admitting: Internal Medicine

## 2015-06-03 ENCOUNTER — Telehealth: Payer: Self-pay

## 2015-06-03 MED ORDER — ADALIMUMAB 40 MG/0.8ML ~~LOC~~ AJKT: 1.0000 "pen " | AUTO-INJECTOR | SUBCUTANEOUS | Status: DC

## 2015-06-03 NOTE — Telephone Encounter (Signed)
Refilled Humira

## 2015-06-07 ENCOUNTER — Other Ambulatory Visit: Payer: Self-pay | Admitting: Internal Medicine

## 2015-06-08 ENCOUNTER — Telehealth: Payer: Self-pay

## 2015-06-08 NOTE — Telephone Encounter (Signed)
-----   Message from Irene Shipper, MD sent at 06/07/2015  3:53 PM EDT ----- OK to refill ----- Message -----    From: Audrea Muscat, CMA    Sent: 06/07/2015   3:20 PM      To: Irene Shipper, MD  Patient requesting refill of Ambient.  Last filled 05/06/2015 #30 with 0 refills.  Ok to refill?

## 2015-06-08 NOTE — Telephone Encounter (Signed)
Refilled Ambien

## 2015-06-09 ENCOUNTER — Telehealth: Payer: Self-pay | Admitting: Internal Medicine

## 2015-06-09 MED ORDER — ADALIMUMAB 40 MG/0.8ML ~~LOC~~ AJKT: 40.0000 mg | AUTO-INJECTOR | SUBCUTANEOUS | Status: DC

## 2015-06-09 NOTE — Telephone Encounter (Signed)
Refilled Humira

## 2015-06-30 ENCOUNTER — Other Ambulatory Visit: Payer: Self-pay | Admitting: Internal Medicine

## 2015-07-06 ENCOUNTER — Other Ambulatory Visit: Payer: Self-pay | Admitting: Internal Medicine

## 2015-07-07 ENCOUNTER — Telehealth: Payer: Self-pay

## 2015-07-07 NOTE — Telephone Encounter (Signed)
Refilled Ambien per Dr. [name]

## 2015-07-07 NOTE — Telephone Encounter (Signed)
-----   Message from Irene Shipper, MD sent at 07/07/2015  2:39 PM EDT ----- Faythe Ghee to refill ----- Message -----    From: Audrea Muscat, CMA    Sent: 07/07/2015   2:04 PM      To: Irene Shipper, MD  Patient requesting refill of Ambien.  Last filled on 06/08/2015.  Ok to refill?

## 2015-07-12 ENCOUNTER — Other Ambulatory Visit: Payer: Self-pay | Admitting: Internal Medicine

## 2015-07-13 ENCOUNTER — Telehealth: Payer: Self-pay

## 2015-07-13 MED ORDER — ZOLPIDEM TARTRATE 10 MG PO TABS: ORAL_TABLET | ORAL | Status: DC

## 2015-07-13 NOTE — Telephone Encounter (Signed)
Pharmacy did not receive rx for Ambien on 07/07/2015 as originally thought.  Refaxed Ambien rx

## 2015-08-01 ENCOUNTER — Other Ambulatory Visit: Payer: Self-pay | Admitting: Internal Medicine

## 2015-08-11 ENCOUNTER — Other Ambulatory Visit: Payer: Self-pay | Admitting: Internal Medicine

## 2015-08-11 NOTE — Telephone Encounter (Signed)
-----   Message from Irene Shipper, MD sent at 08/11/2015  2:09 PM EDT ----- Faythe Ghee to refill ----- Message -----    From: Audrea Muscat, CMA    Sent: 08/11/2015   2:00 PM      To: Irene Shipper, MD  Patient requesting refill of Ambien - last filled 07/07/2015.  Ok to refill?

## 2015-08-11 NOTE — Telephone Encounter (Signed)
Refilled Ambien per Dr. [name]

## 2015-08-24 ENCOUNTER — Other Ambulatory Visit: Payer: Self-pay | Admitting: Internal Medicine

## 2015-09-03 ENCOUNTER — Other Ambulatory Visit: Payer: Self-pay

## 2015-09-03 DIAGNOSIS — K5190 Ulcerative colitis, unspecified, without complications: Secondary | ICD-10-CM

## 2015-09-04 ENCOUNTER — Other Ambulatory Visit: Payer: Self-pay | Admitting: Internal Medicine

## 2015-09-07 ENCOUNTER — Ambulatory Visit (INDEPENDENT_AMBULATORY_CARE_PROVIDER_SITE_OTHER): Payer: Medicare Other | Admitting: Internal Medicine

## 2015-09-07 DIAGNOSIS — K5190 Ulcerative colitis, unspecified, without complications: Secondary | ICD-10-CM

## 2015-09-09 ENCOUNTER — Telehealth: Payer: Self-pay

## 2015-09-09 ENCOUNTER — Other Ambulatory Visit: Payer: Self-pay | Admitting: Internal Medicine

## 2015-09-09 LAB — TB SKIN TEST
INDURATION: 0 mm
TB SKIN TEST: NEGATIVE

## 2015-09-09 NOTE — Telephone Encounter (Signed)
Patient gets Ambien on a somewhat regular basis.  Dr. [name] out of town so Alonza Bogus signed the Ambien rx.  Had not been filled since July

## 2015-09-14 ENCOUNTER — Encounter: Payer: Self-pay | Admitting: Internal Medicine

## 2015-09-24 ENCOUNTER — Encounter: Payer: Self-pay | Admitting: Internal Medicine

## 2015-10-08 ENCOUNTER — Other Ambulatory Visit: Payer: Self-pay | Admitting: Gastroenterology

## 2015-10-11 ENCOUNTER — Other Ambulatory Visit: Payer: Self-pay | Admitting: Gastroenterology

## 2015-10-14 ENCOUNTER — Telehealth: Payer: Self-pay

## 2015-10-14 MED ORDER — ZOLPIDEM TARTRATE 10 MG PO TABS: ORAL_TABLET | ORAL | Status: DC

## 2015-10-14 NOTE — Telephone Encounter (Signed)
-----   Message from Irene Shipper, MD sent at 10/13/2015  1:46 PM EDT ----- OK to refill. Thanks ----- Message -----    From: Audrea Muscat, CMA    Sent: 10/13/2015   1:40 PM      To: Irene Shipper, MD  Patient requesting refill of Ambien.  Last filled 09/09/2015 #30 x 0.  Ok to refill?

## 2015-10-14 NOTE — Telephone Encounter (Signed)
Refilled Ambien and faxed to pharmacy

## 2015-11-01 ENCOUNTER — Other Ambulatory Visit: Payer: Self-pay | Admitting: Internal Medicine

## 2015-11-08 ENCOUNTER — Other Ambulatory Visit: Payer: Self-pay | Admitting: Internal Medicine

## 2015-11-08 ENCOUNTER — Telehealth: Payer: Self-pay

## 2015-11-08 MED ORDER — MESALAMINE 4 G RE ENEM: ENEMA | RECTAL | Status: DC

## 2015-11-08 NOTE — Telephone Encounter (Signed)
Refilled Rowasa 90 day supply

## 2015-11-10 ENCOUNTER — Ambulatory Visit (AMBULATORY_SURGERY_CENTER): Payer: Self-pay

## 2015-11-10 VITALS — Ht 70.5 in | Wt 270.0 lb

## 2015-11-10 DIAGNOSIS — Z860100 Personal history of colon polyps, unspecified: Secondary | ICD-10-CM

## 2015-11-10 MED ORDER — SUPREP BOWEL PREP KIT 17.5-3.13-1.6 GM/177ML PO SOLN: 1.0000 | Freq: Once | ORAL | Status: DC

## 2015-11-10 NOTE — Progress Notes (Signed)
No allergies to eggs or soy No past problems with anesthesia No home oxygen No diet/weight loss meds  No internet

## 2015-11-14 ENCOUNTER — Other Ambulatory Visit: Payer: Self-pay | Admitting: Internal Medicine

## 2015-11-16 ENCOUNTER — Telehealth: Payer: Self-pay

## 2015-11-16 MED ORDER — ZOLPIDEM TARTRATE 10 MG PO TABS: ORAL_TABLET | ORAL | Status: DC

## 2015-11-16 NOTE — Telephone Encounter (Signed)
-----   Message from Irene Shipper, MD sent at 11/15/2015  7:49 PM EST ----- Ok to refill monthly as needed.  ----- Message -----    From: Audrea Muscat, CMA    Sent: 11/15/2015   3:09 PM      To: Irene Shipper, MD  Patient requesting refill of Ambien.  Last filled 10/14/2015.  Ok to refill?

## 2015-11-16 NOTE — Telephone Encounter (Signed)
Refilled Ambien

## 2015-11-24 ENCOUNTER — Other Ambulatory Visit: Payer: Self-pay | Admitting: Internal Medicine

## 2015-11-24 ENCOUNTER — Encounter: Payer: Self-pay | Admitting: Internal Medicine

## 2015-11-24 ENCOUNTER — Ambulatory Visit (AMBULATORY_SURGERY_CENTER): Payer: Medicare Other | Admitting: Internal Medicine

## 2015-11-24 VITALS — BP 104/60 | HR 79 | Temp 97.2°F | Resp 24 | Ht 70.0 in | Wt 270.0 lb

## 2015-11-24 DIAGNOSIS — K635 Polyp of colon: Secondary | ICD-10-CM | POA: Diagnosis not present

## 2015-11-24 DIAGNOSIS — Z860100 Personal history of colon polyps, unspecified: Secondary | ICD-10-CM

## 2015-11-24 DIAGNOSIS — K639 Disease of intestine, unspecified: Secondary | ICD-10-CM | POA: Diagnosis not present

## 2015-11-24 DIAGNOSIS — K5190 Ulcerative colitis, unspecified, without complications: Secondary | ICD-10-CM | POA: Diagnosis not present

## 2015-11-24 DIAGNOSIS — D125 Benign neoplasm of sigmoid colon: Secondary | ICD-10-CM

## 2015-11-24 DIAGNOSIS — K9189 Other postprocedural complications and disorders of digestive system: Secondary | ICD-10-CM | POA: Diagnosis not present

## 2015-11-24 DIAGNOSIS — K5150 Left sided colitis without complications: Secondary | ICD-10-CM | POA: Diagnosis not present

## 2015-11-24 MED ORDER — SODIUM CHLORIDE 0.9 % IV SOLN: 500.0000 mL | INTRAVENOUS | Status: DC

## 2015-11-24 NOTE — Patient Instructions (Signed)
YOU HAD AN ENDOSCOPIC PROCEDURE TODAY AT [facility]:   Refer to the procedure report that was given to you for any specific questions about what was found during the examination.  If the procedure report does not answer your questions, please call your gastroenterologist to clarify.  If you requested that your care partner not be given the details of your procedure findings, then the procedure report has been included in a sealed envelope for you to review at your convenience later.  YOU SHOULD EXPECT: Some feelings of bloating in the abdomen. Passage of more gas than usual.  Walking can help get rid of the air that was put into your GI tract during the procedure and reduce the bloating. If you had a lower endoscopy (such as a colonoscopy or flexible sigmoidoscopy) you may notice spotting of blood in your stool or on the toilet paper. If you underwent a bowel prep for your procedure, you may not have a normal bowel movement for a few days.  Please Note:  You might notice some irritation and congestion in your nose or some drainage.  This is from the oxygen used during your procedure.  There is no need for concern and it should clear up in a day or so.  SYMPTOMS TO REPORT IMMEDIATELY:   Following lower endoscopy (colonoscopy or flexible sigmoidoscopy):  Excessive amounts of blood in the stool  Significant tenderness or worsening of abdominal pains  Swelling of the abdomen that is new, acute  Fever of 100F or higher    For urgent or emergent issues, a gastroenterologist can be reached at any hour by calling [phone].   DIET: Your first meal following the procedure should be a small meal and then it is ok to progress to your normal diet. Heavy or fried foods are harder to digest and may make you feel nauseous or bloated.  Likewise, meals heavy in dairy and vegetables can increase bloating.  Drink plenty of fluids but you should avoid alcoholic beverages for 24  hours.  ACTIVITY:  You should plan to take it easy for the rest of today and you should NOT DRIVE or use heavy machinery until tomorrow (because of the sedation medicines used during the test).    FOLLOW UP: Our staff will call the number listed on your records the next business day following your procedure to check on you and address any questions or concerns that you may have regarding the information given to you following your procedure. If we do not reach you, we will leave a message.  However, if you are feeling well and you are not experiencing any problems, there is no need to return our call.  We will assume that you have returned to your regular daily activities without incident.  If any biopsies were taken you will be contacted by phone or by letter within the next 1-3 weeks.  Please call us at [phone] if you have not heard about the biopsies in 3 weeks.    SIGNATURES/CONFIDENTIALITY: You and/or your care partner have signed paperwork which will be entered into your electronic medical record.  These signatures attest to the fact that that the information above on your After Visit Summary has been reviewed and is understood.  Full responsibility of the confidentiality of this discharge information lies with you and/or your care-partner.  Polyp information given,

## 2015-11-24 NOTE — Progress Notes (Signed)
Patient awakening,vss,report to rn 

## 2015-11-24 NOTE — Progress Notes (Signed)
Called to room to assist during endoscopic procedure.  Patient ID and intended procedure confirmed with present staff. Received instructions for my participation in the procedure from the performing physician.  

## 2015-11-24 NOTE — Op Note (Signed)
[place]  [address]. [place], [address]   COLONOSCOPY PROCEDURE REPORT PATIENT: [name], Matthew  MR#: [id number] BIRTHDATE: Jul 08, 1966 , 57  yrs. 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 Status post subtotal colectomy 2001 (concerns of indeterminate colitis versus Crohn's). Was on Remicade but switched to Humira last year. Doing well clinically as he reports formed bowel movements without bleeding or pain of significance. Last surveillance examination October 2014 with chronic active colitis but no dysplasia. MEDICATIONS: Monitored anesthesia care and Propofol 350 mg IV DESCRIPTION OF PROCEDURE:   After the risks benefits and alternatives of the procedure were thoroughly explained, informed consent was obtained.  The digital rectal exam revealed no abnormalities of the rectum.   The LB [id number]  endoscope was introduced through the anus and advanced to the surgical anastomosis. No adverse events experienced.   The quality of the prep was excellent.  (Suprep was used)  The instrument was then slowly withdrawn as the colon was fully examined. Estimated blood loss is zero unless otherwise noted in this procedure report.  COLON FINDINGS: The colonoscope was advanced to the ileocolonic anastomosis which was located at approximately 40 cm.  The ileum was intubated for 20 cm and appeared normal.  Just below the anastomosis was inflammatory polypoid lesion which was biopsied multiple times.  The residual colonic mucosa revealed scarring and loss of haustral folds and normal vascularity.  However, no active colitis grossly.  Random surveillance biopsies were taken from multiple areas  of the residual colon.  There was a 5 mm polyp in the sigmoid colon which was removed with snare (cold) and submitted for pathologic analysis.  Retroflexed views revealed internal hemorrhoids.  =     =      The scope was withdrawn and the procedure completed. COMPLICATIONS: There were no immediate complications.  ENDOSCOPIC IMPRESSION: 1. Colonic mucosa without active colitis status post surveillance biopsies 2. Inflammatory polypoid lesion biopsied and small polyp removed as described above  RECOMMENDATIONS: 1.  Continue current medications 2.  Await biopsy results . Dr. [name] will send you a letter with the results 3.  Routine office follow-up in 1 year. Please contact the office in the interim should you have any questions or problems  eSigned:  Eustace Quail, MD 11/24/2015 4:46 PM   cc: The Patient and Adriana Reams MD

## 2015-11-25 ENCOUNTER — Telehealth: Payer: Self-pay | Admitting: Internal Medicine

## 2015-11-25 ENCOUNTER — Telehealth: Payer: Self-pay | Admitting: *Deleted

## 2015-11-25 MED ORDER — ZOLPIDEM TARTRATE 10 MG PO TABS: 10.0000 mg | ORAL_TABLET | Freq: Every evening | ORAL | Status: DC | PRN

## 2015-11-25 NOTE — Telephone Encounter (Signed)
Spoke with patient who said his Ambien rx had never been refilled after 10/14/2015, its possible when I faxed it, it didn't go through.  Called pharmacy and gave them verbal instructions to refill Ambien.  Called patient back and told him it would be ready soon.  Patient acknowledged and understood

## 2015-11-25 NOTE — Telephone Encounter (Signed)
  Follow up Call-  Call back number 11/24/2015 10/09/2013  Post procedure Call Back phone  # [phone] hm  Permission to leave phone message Yes No  comments - no answering service     Patient questions:  Do you have a fever, pain , or abdominal swelling? No. Pain Score  0 *  Have you tolerated food without any problems? Yes.    Have you been able to return to your normal activities? Yes.    Do you have any questions about your discharge instructions: Diet   No. Medications  No. Follow up visit  No.  Do you have questions or concerns about your Care? No.  Actions: * If pain score is 4 or above: No action needed, pain <4.

## 2015-11-30 ENCOUNTER — Encounter: Payer: Self-pay | Admitting: Internal Medicine

## 2015-11-30 ENCOUNTER — Other Ambulatory Visit: Payer: Self-pay

## 2015-11-30 ENCOUNTER — Telehealth: Payer: Self-pay | Admitting: Internal Medicine

## 2015-11-30 MED ORDER — ADALIMUMAB 40 MG/0.8ML ~~LOC~~ AJKT: 40.0000 mg | AUTO-INJECTOR | SUBCUTANEOUS | Status: DC

## 2015-11-30 NOTE — Telephone Encounter (Signed)
Humira script refilled for pt.

## 2015-12-02 ENCOUNTER — Other Ambulatory Visit: Payer: Self-pay | Admitting: Internal Medicine

## 2015-12-21 ENCOUNTER — Ambulatory Visit: Payer: Medicare Other | Admitting: Internal Medicine

## 2016-01-05 ENCOUNTER — Other Ambulatory Visit: Payer: Self-pay | Admitting: Internal Medicine

## 2016-01-24 ENCOUNTER — Other Ambulatory Visit: Payer: Self-pay | Admitting: Internal Medicine

## 2016-02-29 ENCOUNTER — Other Ambulatory Visit: Payer: Self-pay | Admitting: Internal Medicine

## 2016-03-21 ENCOUNTER — Other Ambulatory Visit: Payer: Self-pay | Admitting: Internal Medicine

## 2016-03-24 ENCOUNTER — Telehealth: Payer: Self-pay | Admitting: *Deleted

## 2016-03-24 ENCOUNTER — Other Ambulatory Visit: Payer: Self-pay | Admitting: Internal Medicine

## 2016-03-24 NOTE — Telephone Encounter (Signed)
[facility] [place] pharmacist called to say they did not get the prescription for Ambien 10 mg on 03-22-2016.  I gave a verbal order for this today, 03-24-2016.  #30 with 1 refill.  Dr. Scarlette Shorts.

## 2016-03-31 ENCOUNTER — Other Ambulatory Visit: Payer: Self-pay | Admitting: *Deleted

## 2016-03-31 MED ORDER — MESALAMINE 4 G RE ENEM: ENEMA | RECTAL | Status: DC

## 2016-05-01 ENCOUNTER — Other Ambulatory Visit: Payer: Self-pay | Admitting: Internal Medicine

## 2016-05-10 ENCOUNTER — Telehealth: Payer: Self-pay

## 2016-05-10 MED ORDER — MESALAMINE 4 G RE ENEM: ENEMA | RECTAL | Status: DC

## 2016-05-10 NOTE — Telephone Encounter (Signed)
Rowasa sent as 90 day supply

## 2016-05-17 ENCOUNTER — Other Ambulatory Visit: Payer: Self-pay | Admitting: Internal Medicine

## 2016-05-18 ENCOUNTER — Telehealth: Payer: Self-pay

## 2016-05-18 MED ORDER — ZOLPIDEM TARTRATE 10 MG PO TABS: 10.0000 mg | ORAL_TABLET | Freq: Every evening | ORAL | Status: DC | PRN

## 2016-05-18 NOTE — Telephone Encounter (Signed)
-----   Message from Irene Shipper, MD sent at 05/17/2016  3:27 PM EDT ----- Faythe Ghee to refill. Thanks ----- Message -----    From: Audrea Muscat, CMA    Sent: 05/17/2016   3:01 PM      To: Irene Shipper, MD  Patient requesting refill of Ambien.  Last filled 03/23/2016 with one refill. Ok to refill?

## 2016-05-18 NOTE — Telephone Encounter (Signed)
Faxed Ambien

## 2016-06-29 ENCOUNTER — Other Ambulatory Visit: Payer: Self-pay | Admitting: Internal Medicine

## 2016-07-14 ENCOUNTER — Other Ambulatory Visit: Payer: Self-pay | Admitting: Internal Medicine

## 2016-07-17 ENCOUNTER — Other Ambulatory Visit: Payer: Self-pay | Admitting: Internal Medicine

## 2016-08-27 ENCOUNTER — Other Ambulatory Visit: Payer: Self-pay | Admitting: Internal Medicine

## 2016-08-29 ENCOUNTER — Other Ambulatory Visit: Payer: Self-pay | Admitting: Internal Medicine

## 2016-09-01 ENCOUNTER — Encounter: Payer: Self-pay | Admitting: Internal Medicine

## 2016-09-08 ENCOUNTER — Other Ambulatory Visit: Payer: Self-pay | Admitting: Internal Medicine

## 2016-09-14 ENCOUNTER — Other Ambulatory Visit: Payer: Self-pay | Admitting: Internal Medicine

## 2016-10-31 ENCOUNTER — Ambulatory Visit (INDEPENDENT_AMBULATORY_CARE_PROVIDER_SITE_OTHER): Payer: Medicare Other | Admitting: Internal Medicine

## 2016-10-31 ENCOUNTER — Encounter: Payer: Self-pay | Admitting: Internal Medicine

## 2016-10-31 ENCOUNTER — Other Ambulatory Visit: Payer: Medicare Other

## 2016-10-31 ENCOUNTER — Encounter (INDEPENDENT_AMBULATORY_CARE_PROVIDER_SITE_OTHER): Payer: Self-pay

## 2016-10-31 VITALS — BP 160/90 | HR 92 | Ht 70.5 in | Wt 280.0 lb

## 2016-10-31 DIAGNOSIS — K523 Indeterminate colitis: Secondary | ICD-10-CM

## 2016-10-31 MED ORDER — MESALAMINE ER 500 MG PO CPCR: ORAL_CAPSULE | ORAL | 1 refills | Status: DC

## 2016-10-31 MED ORDER — ZOLPIDEM TARTRATE 10 MG PO TABS: ORAL_TABLET | ORAL | 1 refills | Status: DC

## 2016-10-31 MED ORDER — MESALAMINE 4 G RE ENEM: ENEMA | RECTAL | 1 refills | Status: DC

## 2016-10-31 NOTE — Progress Notes (Signed)
HISTORY OF PRESENT ILLNESS:  Matthew [name] is a 50 y.o. 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 Status post subtotal colectomy with residual 40 cm of colon and rectum remaining (surgeon was concerned about Crohn's disease). He is intolerant to immunomodulators. Had been on multiple courses of steroids both oral and topical. As well mesalamine agents. Currently on a combination of Humira and Pentasa. Rowasa enemas as needed. He presents today for follow-up. Last evaluated in the office December 2015. Last colonoscopy November 2016. Office follow-up in 1 year recommended. He did have chronic active colitis on his last exam. Follow-up colonoscopy in 2 years recommended. He is pleased report that he is doing well. Typically has 5-6 bowel movements daily. Occasionally 8. For the most part solid. Rare episodes of bleeding. No pain or fevers. No incontinence. He is taking his enemas daily. No problems with medication. His mother is suffering with ovarian cancer  REVIEW OF SYSTEMS:  All non-GI ROS negative except for arthritis, cough, fatigue, nosebleeds, itching, headaches, skin rash  Past Medical History:  Diagnosis Date  . Colon polyps    2007  . Glaucoma   . History of blood transfusion    2001  . Inflammatory bowel disease   . Primary generalized (osteo)arthritis   . Ulcerative colitis, unspecified     Past Surgical History:  Procedure Laterality Date  . COLON RESECTION  2001  . cornea transplant both eyes Bilateral    left, 2004; right 2005  . EYE SURGERY     laser OS April 2014  . GLAUCOMA SURGERY Left 2014  . VENTRAL HERNIA REPAIR  2002    Social History [name]  reports that he has never smoked. He has never used smokeless tobacco. He reports that he does not drink alcohol or use drugs.  family history includes Breast cancer in his cousin; Colitis in his father; Colon cancer in his maternal grandfather; Colon polyps in his father  and mother; Heart disease in his father and paternal grandmother; Liver cancer in his maternal grandfather; Prostate cancer in his cousin.  Allergies  Allergen Reactions  . Contrast Media [Iodinated Diagnostic Agents] Hives  . Dimetapp Allergy Sinus [Brompheniramine-Ppa-Apap] Hives       PHYSICAL EXAMINATION: Vital signs: BP (!) 160/90   Pulse 92   Ht 5' 10.5" (1.791 m)   Wt 280 lb (127 kg)   BMI 39.61 kg/m   Constitutional: generally well-appearing, no acute distress Psychiatric: alert and oriented x3, cooperative Eyes: extraocular movements intact, anicteric, conjunctiva pink. Left eye blind Mouth: oral pharynx moist, no lesions Neck: supple no lymphadenopathy Cardiovascular: heart regular rate and rhythm, no murmur Lungs: clear to auscultation bilaterally Abdomen: soft, be's, nontender, nondistended, no obvious ascites, no peritoneal signs, normal bowel sounds, no organomegaly. Multiple surgical incisions well-healed Rectal:Omitted Extremities: no clubbing or cyanosis. Trace lower extremity edema bilaterally Skin: Facial rash. no lesions on visible extremities Neuro: No focal deficits. No nerves intact.  ASSESSMENT:  #1. Indeterminate colitis status post subtotal colectomy. Stable on current medical regimen   PLAN:  #1. Continue Humira, Pentasa, and Rowasa. Medications refilled #2. Surveillance colonoscopy in 1 year. Contact the office in the interim for any questions or problems #3. TB test if not up-to-date

## 2016-10-31 NOTE — Patient Instructions (Signed)
Your physician has requested that you go to the basement for the following lab work before leaving today:  Matthew [name]  We have sent the following medications to your pharmacy for you to pick up at your convenience:  Pentasa, Rowasa, Ambien

## 2016-11-02 LAB — QUANTIFERON TB GOLD ASSAY (BLOOD)
INTERFERON GAMMA RELEASE ASSAY: NEGATIVE
Mitogen-Nil: >10 IU/mL
QUANTIFERON NIL VALUE: 0.03 [IU]/mL
Quantiferon Tb Ag Minus Nil Value: 0.01 IU/mL

## 2016-12-08 ENCOUNTER — Telehealth: Payer: Self-pay | Admitting: Internal Medicine

## 2016-12-11 NOTE — Telephone Encounter (Signed)
Gave to [employer]

## 2016-12-25 HISTORY — PX: COLONOSCOPY: SHX174

## 2016-12-29 ENCOUNTER — Other Ambulatory Visit: Payer: Self-pay | Admitting: Internal Medicine

## 2017-02-21 ENCOUNTER — Other Ambulatory Visit: Payer: Self-pay | Admitting: Internal Medicine

## 2017-03-01 ENCOUNTER — Other Ambulatory Visit: Payer: Self-pay | Admitting: Internal Medicine

## 2017-03-03 DIAGNOSIS — H0015 Chalazion left lower eyelid: Secondary | ICD-10-CM | POA: Insufficient documentation

## 2017-04-16 ENCOUNTER — Other Ambulatory Visit: Payer: Self-pay | Admitting: Internal Medicine

## 2017-04-18 ENCOUNTER — Other Ambulatory Visit: Payer: Self-pay | Admitting: Internal Medicine

## 2017-04-20 ENCOUNTER — Other Ambulatory Visit: Payer: Self-pay | Admitting: Internal Medicine

## 2017-04-25 ENCOUNTER — Other Ambulatory Visit: Payer: Self-pay | Admitting: Internal Medicine

## 2017-06-18 ENCOUNTER — Other Ambulatory Visit: Payer: Self-pay | Admitting: Internal Medicine

## 2017-06-19 ENCOUNTER — Telehealth: Payer: Self-pay | Admitting: *Deleted

## 2017-06-19 MED ORDER — ZOLPIDEM TARTRATE 10 MG PO TABS: 10.0000 mg | ORAL_TABLET | Freq: Every evening | ORAL | 1 refills | Status: DC | PRN

## 2017-06-19 NOTE — Telephone Encounter (Signed)
Faxing rx for Matthew [name] while she is in Neuro

## 2017-06-23 ENCOUNTER — Other Ambulatory Visit: Payer: Self-pay | Admitting: Internal Medicine

## 2017-07-06 ENCOUNTER — Telehealth: Payer: Self-pay | Admitting: Internal Medicine

## 2017-07-06 NOTE — Telephone Encounter (Signed)
Pt is due for his Humira today. States he has broken out with little red places on his legs and hands that look like small sores. Reports these have been there for about 1 1/2-2 weeks. Please advise regarding Humira.

## 2017-07-06 NOTE — Telephone Encounter (Signed)
Spoke with pt and he is aware.

## 2017-07-06 NOTE — Telephone Encounter (Signed)
Recommend he see his PCP or dermatologist ASAP for evaluation. Probably best to hold Humira to make sure these aren't infectious. Hopefully he can be evaluated quickly and if cleared resume Humira

## 2017-07-06 NOTE — Telephone Encounter (Signed)
Attempted to call pt back but receive message that voicemail box had not been set up yet. Will try again.

## 2017-07-16 ENCOUNTER — Other Ambulatory Visit: Payer: Self-pay | Admitting: Internal Medicine

## 2017-08-13 ENCOUNTER — Other Ambulatory Visit: Payer: Self-pay | Admitting: Internal Medicine

## 2017-10-02 ENCOUNTER — Encounter: Payer: Self-pay | Admitting: Internal Medicine

## 2017-10-06 ENCOUNTER — Other Ambulatory Visit: Payer: Self-pay | Admitting: Internal Medicine

## 2017-10-07 ENCOUNTER — Other Ambulatory Visit: Payer: Self-pay | Admitting: Internal Medicine

## 2017-11-13 ENCOUNTER — Ambulatory Visit (AMBULATORY_SURGERY_CENTER): Payer: Self-pay

## 2017-11-13 ENCOUNTER — Encounter (INDEPENDENT_AMBULATORY_CARE_PROVIDER_SITE_OTHER): Payer: Self-pay

## 2017-11-13 VITALS — Ht 70.5 in | Wt 268.0 lb

## 2017-11-13 DIAGNOSIS — Z860100 Personal history of colon polyps, unspecified: Secondary | ICD-10-CM

## 2017-11-13 MED ORDER — SUPREP BOWEL PREP KIT 17.5-3.13-1.6 GM/177ML PO SOLN: 1.0000 | Freq: Once | ORAL | 0 refills | Status: AC

## 2017-11-13 NOTE — Progress Notes (Signed)
No allergies to eggs or soy No diet meds No home oxygen No past problems with anesthesia  Declined emmi

## 2017-11-22 ENCOUNTER — Encounter: Payer: Self-pay | Admitting: Internal Medicine

## 2017-11-26 DIAGNOSIS — I10 Essential (primary) hypertension: Secondary | ICD-10-CM | POA: Insufficient documentation

## 2017-11-26 DIAGNOSIS — E119 Type 2 diabetes mellitus without complications: Secondary | ICD-10-CM | POA: Insufficient documentation

## 2017-12-06 ENCOUNTER — Ambulatory Visit (AMBULATORY_SURGERY_CENTER): Payer: Medicare HMO | Admitting: Internal Medicine

## 2017-12-06 ENCOUNTER — Encounter: Payer: Self-pay | Admitting: Internal Medicine

## 2017-12-06 VITALS — BP 98/58 | HR 75 | Temp 98.2°F | Resp 18 | Ht 70.5 in | Wt 268.0 lb

## 2017-12-06 DIAGNOSIS — Z86010 Personal history of colonic polyps: Secondary | ICD-10-CM | POA: Diagnosis not present

## 2017-12-06 DIAGNOSIS — K621 Rectal polyp: Secondary | ICD-10-CM

## 2017-12-06 DIAGNOSIS — K523 Indeterminate colitis: Secondary | ICD-10-CM | POA: Diagnosis not present

## 2017-12-06 DIAGNOSIS — K529 Noninfective gastroenteritis and colitis, unspecified: Secondary | ICD-10-CM | POA: Diagnosis not present

## 2017-12-06 MED ORDER — SODIUM CHLORIDE 0.9 % IV SOLN: 500.0000 mL | INTRAVENOUS | Status: DC

## 2017-12-06 NOTE — Patient Instructions (Signed)
YOU HAD AN ENDOSCOPIC PROCEDURE TODAY AT [facility]:   Refer to the procedure report that was given to you for any specific questions about what was found during the examination.  If the procedure report does not answer your questions, please call your gastroenterologist to clarify.  If you requested that your care partner not be given the details of your procedure findings, then the procedure report has been included in a sealed envelope for you to review at your convenience later.  YOU SHOULD EXPECT: Some feelings of bloating in the abdomen. Passage of more gas than usual.  Walking can help get rid of the air that was put into your GI tract during the procedure and reduce the bloating. If you had a lower endoscopy (such as a colonoscopy or flexible sigmoidoscopy) you may notice spotting of blood in your stool or on the toilet paper. If you underwent a bowel prep for your procedure, you may not have a normal bowel movement for a few days.  Please Note:  You might notice some irritation and congestion in your nose or some drainage.  This is from the oxygen used during your procedure.  There is no need for concern and it should clear up in a day or so.  SYMPTOMS TO REPORT IMMEDIATELY:   Following lower endoscopy (colonoscopy or flexible sigmoidoscopy):  Excessive amounts of blood in the stool  Significant tenderness or worsening of abdominal pains  Swelling of the abdomen that is new, acute  Fever of 100F or higher   For urgent or emergent issues, a gastroenterologist can be reached at any hour by calling [phone].   DIET:  We do recommend a small meal at first, but then you may proceed to your regular diet.  Drink plenty of fluids but you should avoid alcoholic beverages for 24 hours.  ACTIVITY:  You should plan to take it easy for the rest of today and you should NOT DRIVE or use heavy machinery until tomorrow (because of the sedation medicines used during the test).     FOLLOW UP: Our staff will call the number listed on your records the next business day following your procedure to check on you and address any questions or concerns that you may have regarding the information given to you following your procedure. If we do not reach you, we will leave a message.  However, if you are feeling well and you are not experiencing any problems, there is no need to return our call.  We will assume that you have returned to your regular daily activities without incident.  If any biopsies were taken you will be contacted by phone or by letter within the next 1-3 weeks.  Please call us at [phone] if you have not heard about the biopsies in 3 weeks.    SIGNATURES/CONFIDENTIALITY: You and/or your care partner have signed paperwork which will be entered into your electronic medical record.  These signatures attest to the fact that that the information above on your After Visit Summary has been reviewed and is understood.  Full responsibility of the confidentiality of this discharge information lies with you and/or your care-partner.  Drink plenty of fluids today.  You will need another colonoscopy in 2 years.

## 2017-12-06 NOTE — Progress Notes (Signed)
Called to room to assist during endoscopic procedure.  Patient ID and intended procedure confirmed with present staff. Received instructions for my participation in the procedure from the performing physician.  

## 2017-12-06 NOTE — Op Note (Signed)
[facility] Patient Name: Matthew [name] Procedure Date: 12/06/2017 9:21 AM MRN: [id number] Endoscopist: Docia Chuck. [name] , MD Age: 51 Referring MD:  Date of Birth: 1965-12-30 Gender: Male Account #: [id number] Procedure:                Colonoscopy, with biopsy; with cold snare                            polypectomy x 1 Indications:              High risk colon cancer surveillance: Ulcerative                            pancolitis of 8 (or more) years duration. Greater                            than 20 year history of universal ulcerative                            colitis. Subtotal colectomy 2001 (felt by the                            surgeon at [facility] to be indeterminate colitis with                            concerns for Crohn's). Multiple previous                            surveillance exams. Last examination November 2016.                            Mild active chronic colitis on biopsies and                            non-adenomatous colon polyps. NOTE: The patient had                            a skin eruption in July. Was seen by dermatologist                            in his home town who diagnosed Humira related                            eczema. Patient has been off Humira since (no                            correspondence with our office) but doing well from                            a GI standpoint. He does continue on oral and                            topical mesalamine therapies Medicines:  Monitored Anesthesia Care Procedure:                Pre-Anesthesia Assessment:                           - Prior to the procedure, a History and Physical                            was performed, and patient medications and                            allergies were reviewed. The patient's tolerance of                            previous anesthesia was also reviewed. The risks                            and benefits of the procedure and the sedation                        options and risks were discussed with the patient.                            All questions were answered, and informed consent                            was obtained. Prior Anticoagulants: The patient has                            taken no previous anticoagulant or antiplatelet                            agents. ASA Grade Assessment: II - A patient with                            mild systemic disease. After reviewing the risks                            and benefits, the patient was deemed in                            satisfactory condition to undergo the procedure.                           After obtaining informed consent, the colonoscope                            was passed under direct vision. Throughout the                            procedure, the patient's blood pressure, pulse, and                            oxygen saturations were monitored continuously. The  Colonoscope was introduced through the anus and                            advanced to the the ileocolonic anastomosis. The                            rectum and surgical anastomosis was photographed.                            The quality of the bowel preparation was excellent.                            The colonoscopy was performed without difficulty.                            The patient tolerated the procedure well. The bowel                            preparation used was SUPREP. Scope In: 9:34:57 AM Scope Out: 9:48:41 AM Scope Withdrawal Time: 0 hours 11 minutes 37 seconds  Total Procedure Duration: 0 hours 13 minutes 44 seconds  Findings:                 There was mild variable degrees of Inflammation                            characterized by erythema, granularity, loss of                            vascularity and scarring . This was mild in                            severity, and when compared to previous                            examinations, the findings  are unchanged. Biopsies                            were taken with a cold forceps for histology, every                            8-10 cm in 4 quadrants. Residual colon was                            approximately 40 cm. The anastomosis was                            unremarkable as was the small bowel.                           A 5 mm polyp was found in the rectum. The polyp was                            sessile. The polyp  was removed with a cold snare.                            Resection and retrieval were complete.                           The exam was otherwise without abnormality on                            direct and retroflexion views. Complications:            No immediate complications. Estimated blood loss:                            None. Estimated Blood Loss:     Estimated blood loss: none. Impression:               - Left-sided colitis. Inflammation was found. This                            was mild in severity. The findings are unchanged                            compared to previous examinations. Biopsied.                           - One 5 mm polyp in the rectum, removed with a cold                            snare. Resected and retrieved.                           - The examination was otherwise normal on direct                            and retroflexion views. Recommendation:           - Repeat colonoscopy in 2 years for surveillance.                           - Patient has a contact number available for                            emergencies. The signs and symptoms of potential                            delayed complications were discussed with the                            patient. Return to normal activities tomorrow.                            Written discharge instructions were provided to the                            patient.                           -  Resume previous diet.                           - Continue present medications.                            - Await pathology results.                           - Routine office follow-up one year Jacorie Ernsberger N. [name], MD 12/06/2017 10:02:08 AM This report has been signed electronically.

## 2017-12-06 NOTE — Progress Notes (Signed)
1000am-Ephedrine 61m given IV per [place] for low bp relief.  BP up to 90/42 after medication given. Will continue to monitor. IV wide open.

## 2017-12-06 NOTE — Progress Notes (Signed)
To PACU, B/P still low treated with Ephederine 77m in PACU. Pt alert and reports to be feeling good. B/p better after last treatment of Ephederine. Report to [facility]

## 2017-12-07 ENCOUNTER — Telehealth: Payer: Self-pay | Admitting: *Deleted

## 2017-12-07 NOTE — Telephone Encounter (Signed)
  Follow up Call-  Call back number 12/06/2017 11/24/2015  Post procedure Call Back phone  # [phone]  Permission to leave phone message No Yes  Some recent data might be hidden     Patient questions:  Do you have a fever, pain , or abdominal swelling? No. Pain Score  0 *  Have you tolerated food without any problems? Yes.    Have you been able to return to your normal activities? Yes.    Do you have any questions about your discharge instructions: Diet   No. Medications  No. Follow up visit  No.  Do you have questions or concerns about your Care? No.  Actions: * If pain score is 4 or above: No action needed, pain <4.

## 2017-12-10 ENCOUNTER — Other Ambulatory Visit: Payer: Self-pay | Admitting: Internal Medicine

## 2017-12-12 ENCOUNTER — Encounter: Payer: Self-pay | Admitting: Internal Medicine

## 2018-01-07 ENCOUNTER — Other Ambulatory Visit: Payer: Self-pay | Admitting: Internal Medicine

## 2018-02-04 ENCOUNTER — Other Ambulatory Visit: Payer: Self-pay | Admitting: Internal Medicine

## 2018-02-13 ENCOUNTER — Telehealth: Payer: Self-pay | Admitting: Internal Medicine

## 2018-02-14 NOTE — Telephone Encounter (Signed)
Patient is aware of medication approval states his insurance mailed him a letter. States he is glad it has all been resolved.

## 2018-02-14 NOTE — Telephone Encounter (Signed)
Submitted prior authorization for Pentasa and Mesalamine to Cover My Meds.  Awaiting response

## 2018-02-14 NOTE — Telephone Encounter (Signed)
Pentasa approved.  Received fax that said the Mesalamine enema did not need a prior authorization.  Called patient - no answer, no voicemail

## 2018-03-21 ENCOUNTER — Telehealth: Payer: Self-pay | Admitting: Internal Medicine

## 2018-03-21 MED ORDER — MESALAMINE 4 G RE ENEM: ENEMA | RECTAL | 1 refills | Status: DC

## 2018-03-21 NOTE — Telephone Encounter (Signed)
Refilled Rowasa

## 2018-03-31 ENCOUNTER — Other Ambulatory Visit: Payer: Self-pay | Admitting: Internal Medicine

## 2018-04-03 ENCOUNTER — Other Ambulatory Visit: Payer: Self-pay

## 2018-04-03 MED ORDER — MESALAMINE ER 500 MG PO CPCR: ORAL_CAPSULE | ORAL | 2 refills | Status: DC

## 2018-05-27 ENCOUNTER — Other Ambulatory Visit: Payer: Self-pay | Admitting: Internal Medicine

## 2018-07-22 ENCOUNTER — Other Ambulatory Visit: Payer: Self-pay | Admitting: Internal Medicine

## 2018-07-23 NOTE — Telephone Encounter (Signed)
ok to refill his prescription

## 2018-07-23 NOTE — Telephone Encounter (Signed)
Please advise on refill request for Ambien. Pt was last seen 10-2016 and colonoscopy 11-2017.

## 2018-08-15 ENCOUNTER — Other Ambulatory Visit: Payer: Self-pay | Admitting: Internal Medicine

## 2018-09-16 ENCOUNTER — Other Ambulatory Visit: Payer: Self-pay | Admitting: Internal Medicine

## 2018-09-19 ENCOUNTER — Telehealth: Payer: Self-pay | Admitting: Internal Medicine

## 2018-09-19 NOTE — Telephone Encounter (Signed)
Refilled Ambien

## 2018-09-19 NOTE — Telephone Encounter (Signed)
ambien refilled 

## 2018-11-08 ENCOUNTER — Other Ambulatory Visit: Payer: Self-pay | Admitting: Internal Medicine

## 2018-11-11 ENCOUNTER — Other Ambulatory Visit: Payer: Self-pay | Admitting: Internal Medicine

## 2018-11-11 DIAGNOSIS — E785 Hyperlipidemia, unspecified: Secondary | ICD-10-CM | POA: Insufficient documentation

## 2018-12-02 ENCOUNTER — Ambulatory Visit: Payer: Medicare HMO | Admitting: Internal Medicine

## 2018-12-02 ENCOUNTER — Encounter: Payer: Self-pay | Admitting: Internal Medicine

## 2018-12-02 VITALS — BP 128/86 | HR 80 | Ht 70.5 in | Wt 268.1 lb

## 2018-12-02 DIAGNOSIS — K5190 Ulcerative colitis, unspecified, without complications: Secondary | ICD-10-CM

## 2018-12-02 DIAGNOSIS — K523 Indeterminate colitis: Secondary | ICD-10-CM

## 2018-12-02 MED ORDER — MESALAMINE 4 G RE ENEM: ENEMA | RECTAL | 2 refills | Status: DC

## 2018-12-02 MED ORDER — MESALAMINE ER 500 MG PO CPCR: ORAL_CAPSULE | ORAL | 2 refills | Status: DC

## 2018-12-02 NOTE — Patient Instructions (Signed)
We have sent the following medications to your pharmacy for you to pick up at your convenience:  Pentasa, Rowasa  Please follow up in one year

## 2018-12-02 NOTE — Progress Notes (Signed)
HISTORY OF PRESENT ILLNESS:  Matthew [name] is a 52 y.o. 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  Status post subtotal colectomy with residual 40 cm of colon and rectum remaining (surgeon at [facility] was concerned about Crohn's disease).  He is intolerant to immune modulators.  He has been on multiple courses of steroids both oral and topical.  He presents today for routine follow-up.  Last seen in this office November 2017.  Last seen in this facility December 2018 when he underwent colonoscopy.  He was found to have left-sided colitis which was mild compared to previous examinations.  As well, one polyp removed.  Routine follow-up surveillance in 2 years recommended.  Routine office follow-up at this time recommended.  I was sorry to hear that Matthew [name] his mother passed away.  I was also sorry to hear that his father has recently been diagnosed with cancer.  From a GI standpoint, he has been doing well.  He describes 5 or 6 bowel movements per day which are generally formed (80% of the time).  No rectal bleeding except for rare minor blood on the tissue once or twice per month.  Over time his symptoms have been stable to improved.  He has changed his diet with less consumption of fruits and fruit juices.  He feels that this has helped.  He has had no abdominal pain.  He has had some much-needed weight loss of about 10 to 12 pounds.  He was taken off Humira by his dermatologist for eczema.  He remains off Humira.  Currently takes Imodium once per day.  Also Pentasa 2 g twice daily and Rowasa enemas once at night.  REVIEW OF SYSTEMS:  All non-GI ROS negative unless otherwise stated in the HPI except for back pain, headaches, itching, muscle cramps, nosebleed, excessive urination, ankle edema, sleeping problems, skin rash  Past Medical History:  Diagnosis Date  . Colon polyps    2007  . Diabetes ([place])   . Glaucoma   . History of blood transfusion    2001   . HTN (hypertension)   . Hyperlipidemia   . Inflammatory bowel disease   . Primary generalized (osteo)arthritis   . Ulcerative colitis, unspecified     Past Surgical History:  Procedure Laterality Date  . COLON RESECTION  2001  . cornea transplant both eyes Bilateral    left, 2004; right 2005  . EYE SURGERY     laser OS April 2014  . GLAUCOMA SURGERY Left 2014  . VENTRAL HERNIA REPAIR  2002    Social History Matthew [name]  reports that he has never smoked. He has never used smokeless tobacco. He reports that he does not drink alcohol or use drugs.  family history includes Breast cancer in his cousin; Colitis in his father; Colon cancer in his maternal grandfather; Colon polyps in his father and mother; Heart disease in his father and paternal grandmother; Liver cancer in his maternal grandfather; Prostate cancer in his cousin.  Allergies  Allergen Reactions  . Contrast Media [Iodinated Diagnostic Agents] Hives  . Dimetapp Allergy Sinus [Brompheniramine-Ppa-Apap] Hives       PHYSICAL EXAMINATION: Vital signs: BP 128/86   Pulse 80   Ht 5' 10.5" (1.791 m)   Wt 268 lb 2 oz (121.6 kg)   BMI 37.93 kg/m   Constitutional: generally well-appearing, no acute distress Psychiatric: alert and oriented x3, cooperative Eyes: extraocular movements intact, anicteric, conjunctiva pink.  Distortion of left  eye Mouth: oral pharynx moist, no lesions Neck: supple no lymphadenopathy Cardiovascular: heart regular rate and rhythm, no murmur Lungs: clear to auscultation bilaterally Abdomen: soft, obese, nontender, nondistended, no obvious ascites, no peritoneal signs, normal bowel sounds, no organomegaly.  Previous surgical incision well-healed.  No hernia Rectal: Extremities: no clubbing, cyanosis, or lower extremity edema bilaterally Skin: Eczema.  No lesions on visible extremities Neuro: No focal deficits.  Cranial nerves intact  ASSESSMENT:  1.  Indeterminate colitis.  Status  post subtotal colectomy.  Stable on current medical regimen with mesalamine 2.  Last colonoscopy December 2018   PLAN:  1.  Continue mesalamine therapies.  Prescriptions refilled 2.  Repeat surveillance colonoscopy in 1 year 3.  Interval GI follow-up as needed  25-minute spent face-to-face with the patient.  Greater than 50% the time used for counseling regarding his chronic colitis

## 2019-01-07 ENCOUNTER — Other Ambulatory Visit: Payer: Self-pay | Admitting: Internal Medicine

## 2019-03-12 ENCOUNTER — Telehealth: Payer: Self-pay | Admitting: Internal Medicine

## 2019-03-12 MED ORDER — ZOLPIDEM TARTRATE 10 MG PO TABS: 10.0000 mg | ORAL_TABLET | Freq: Every evening | ORAL | 4 refills | Status: DC | PRN

## 2019-03-12 NOTE — Telephone Encounter (Signed)
Patient's last office visit 12/02/2018.  Ok to refill Ambien?

## 2019-03-12 NOTE — Telephone Encounter (Signed)
Refilled Ambien with multiple refills

## 2019-03-12 NOTE — Telephone Encounter (Signed)
Please refill multiple times

## 2019-03-12 NOTE — Telephone Encounter (Signed)
Pt called stating that he has been trying to rf ambien but his pharmacy told him to contact us. Pls call him.

## 2019-03-24 ENCOUNTER — Other Ambulatory Visit: Payer: Self-pay

## 2019-03-24 MED ORDER — MESALAMINE 4 G RE ENEM: ENEMA | RECTAL | 2 refills | Status: DC

## 2019-03-31 ENCOUNTER — Telehealth: Payer: Self-pay

## 2019-03-31 NOTE — Telephone Encounter (Signed)
Incoming fax request for medication alternative . Pt is taking Pentasa 500 mg caps. 4 po BID. This medication is no longer covered with patients insurance.  Will work on prior [place]. Submitted via covermymeds

## 2019-04-01 NOTE — Telephone Encounter (Signed)
Approved drug coverage 12-23-2018 to 12-25-2019 completed on cover my meds.

## 2019-05-08 DIAGNOSIS — H02886 Meibomian gland dysfunction of left eye, unspecified eyelid: Secondary | ICD-10-CM | POA: Insufficient documentation

## 2019-05-08 DIAGNOSIS — H01009 Unspecified blepharitis unspecified eye, unspecified eyelid: Secondary | ICD-10-CM | POA: Insufficient documentation

## 2019-05-08 DIAGNOSIS — H02883 Meibomian gland dysfunction of right eye, unspecified eyelid: Secondary | ICD-10-CM | POA: Insufficient documentation

## 2019-05-08 DIAGNOSIS — Z9889 Other specified postprocedural states: Secondary | ICD-10-CM | POA: Insufficient documentation

## 2019-05-08 DIAGNOSIS — H10823 Rosacea conjunctivitis, bilateral: Secondary | ICD-10-CM | POA: Insufficient documentation

## 2019-05-27 ENCOUNTER — Other Ambulatory Visit: Payer: Self-pay

## 2019-05-27 MED ORDER — MESALAMINE 4 G RE ENEM: ENEMA | RECTAL | 2 refills | Status: DC

## 2019-08-01 ENCOUNTER — Other Ambulatory Visit: Payer: Self-pay

## 2019-08-01 MED ORDER — ZOLPIDEM TARTRATE 10 MG PO TABS: 10.0000 mg | ORAL_TABLET | Freq: Every evening | ORAL | 4 refills | Status: DC | PRN

## 2019-08-27 ENCOUNTER — Other Ambulatory Visit: Payer: Self-pay

## 2019-08-27 MED ORDER — MESALAMINE 4 G RE ENEM: ENEMA | RECTAL | 2 refills | Status: DC

## 2019-10-17 ENCOUNTER — Other Ambulatory Visit: Payer: Self-pay

## 2019-10-17 MED ORDER — MESALAMINE 4 G RE ENEM: ENEMA | RECTAL | 2 refills | Status: DC

## 2019-11-26 ENCOUNTER — Encounter: Payer: Self-pay | Admitting: Internal Medicine

## 2019-12-09 ENCOUNTER — Encounter: Payer: Self-pay | Admitting: Internal Medicine

## 2019-12-12 ENCOUNTER — Other Ambulatory Visit: Payer: Self-pay

## 2019-12-12 MED ORDER — MESALAMINE 4 G RE ENEM: ENEMA | RECTAL | 2 refills | Status: DC

## 2019-12-22 ENCOUNTER — Other Ambulatory Visit: Payer: Self-pay

## 2019-12-22 MED ORDER — MESALAMINE ER 500 MG PO CPCR: ORAL_CAPSULE | ORAL | 2 refills | Status: DC

## 2019-12-29 ENCOUNTER — Other Ambulatory Visit: Payer: Self-pay

## 2019-12-29 MED ORDER — ZOLPIDEM TARTRATE 10 MG PO TABS: 10.0000 mg | ORAL_TABLET | Freq: Every evening | ORAL | 1 refills | Status: DC | PRN

## 2019-12-30 ENCOUNTER — Ambulatory Visit (AMBULATORY_SURGERY_CENTER): Payer: Medicare HMO | Admitting: *Deleted

## 2019-12-30 ENCOUNTER — Other Ambulatory Visit: Payer: Self-pay

## 2019-12-30 ENCOUNTER — Encounter: Payer: Self-pay | Admitting: Internal Medicine

## 2019-12-30 VITALS — Temp 96.0°F | Ht 70.5 in | Wt 258.0 lb

## 2019-12-30 DIAGNOSIS — K5190 Ulcerative colitis, unspecified, without complications: Secondary | ICD-10-CM

## 2019-12-30 DIAGNOSIS — Z1159 Encounter for screening for other viral diseases: Secondary | ICD-10-CM

## 2019-12-30 MED ORDER — NA SULFATE-K SULFATE-MG SULF 17.5-3.13-1.6 GM/177ML PO SOLN: ORAL | 0 refills | Status: DC

## 2019-12-30 NOTE — Progress Notes (Signed)
Patient is here in-person for PV. Patient denies any allergies to eggs or soy. Patient denies any problems with anesthesia/sedation. Patient denies any oxygen use at home. Patient denies taking any diet/weight loss medications or blood thinners. Patient is not being treated for MRSA or C-diff. EMMI education assisgned to the patient for the procedure, this was explained and instructions given to patient. COVID-19 screening test is on 1/13, the pt is aware. Pt is aware that care partner will wait in the car during procedure; if they feel like they will be too hot or cold to wait in the car; they may wait in the 4 th floor lobby. Patient is aware to bring only one care partner. We want them to wear a mask (we do not have any that we can provide them), practice social distancing, and we will check their temperatures when they get here.  I did remind the patient that their care partner needs to stay in the parking lot the entire time and have a cell phone available, we will call them when the pt is ready for discharge. Patient will wear mask into building.

## 2020-01-07 ENCOUNTER — Ambulatory Visit (INDEPENDENT_AMBULATORY_CARE_PROVIDER_SITE_OTHER): Payer: Medicare HMO

## 2020-01-07 ENCOUNTER — Other Ambulatory Visit: Payer: Self-pay | Admitting: Internal Medicine

## 2020-01-07 DIAGNOSIS — Z1159 Encounter for screening for other viral diseases: Secondary | ICD-10-CM

## 2020-01-08 LAB — SARS CORONAVIRUS 2 (TAT 6-24 HRS): SARS Coronavirus 2: NEGATIVE

## 2020-01-12 ENCOUNTER — Encounter: Payer: Self-pay | Admitting: Internal Medicine

## 2020-01-12 ENCOUNTER — Other Ambulatory Visit: Payer: Self-pay

## 2020-01-12 ENCOUNTER — Ambulatory Visit (AMBULATORY_SURGERY_CENTER): Payer: Medicare HMO | Admitting: Internal Medicine

## 2020-01-12 VITALS — BP 105/58 | HR 74 | Temp 97.6°F | Resp 12 | Ht 70.5 in | Wt 258.0 lb

## 2020-01-12 DIAGNOSIS — K5140 Inflammatory polyps of colon without complications: Secondary | ICD-10-CM | POA: Diagnosis not present

## 2020-01-12 DIAGNOSIS — K5289 Other specified noninfective gastroenteritis and colitis: Secondary | ICD-10-CM | POA: Diagnosis not present

## 2020-01-12 DIAGNOSIS — K5190 Ulcerative colitis, unspecified, without complications: Secondary | ICD-10-CM

## 2020-01-12 DIAGNOSIS — D128 Benign neoplasm of rectum: Secondary | ICD-10-CM

## 2020-01-12 DIAGNOSIS — K529 Noninfective gastroenteritis and colitis, unspecified: Secondary | ICD-10-CM | POA: Diagnosis not present

## 2020-01-12 MED ORDER — SODIUM CHLORIDE 0.9 % IV SOLN: 500.0000 mL | Freq: Once | INTRAVENOUS | Status: DC

## 2020-01-12 NOTE — Patient Instructions (Signed)
Handout on polyps given to you today.  Await pathology results.   YOU HAD AN ENDOSCOPIC PROCEDURE TODAY AT [facility]:   Refer to the procedure report that was given to you for any specific questions about what was found during the examination.  If the procedure report does not answer your questions, please call your gastroenterologist to clarify.  If you requested that your care partner not be given the details of your procedure findings, then the procedure report has been included in a sealed envelope for you to review at your convenience later.  YOU SHOULD EXPECT: Some feelings of bloating in the abdomen. Passage of more gas than usual.  Walking can help get rid of the air that was put into your GI tract during the procedure and reduce the bloating. If you had a lower endoscopy (such as a colonoscopy or flexible sigmoidoscopy) you may notice spotting of blood in your stool or on the toilet paper. If you underwent a bowel prep for your procedure, you may not have a normal bowel movement for a few days.  Please Note:  You might notice some irritation and congestion in your nose or some drainage.  This is from the oxygen used during your procedure.  There is no need for concern and it should clear up in a day or so.  SYMPTOMS TO REPORT IMMEDIATELY:   Following lower endoscopy (colonoscopy or flexible sigmoidoscopy):  Excessive amounts of blood in the stool  Significant tenderness or worsening of abdominal pains  Swelling of the abdomen that is new, acute  Fever of 100F or higher   For urgent or emergent issues, a gastroenterologist can be reached at any hour by calling [phone].   DIET:  We do recommend a small meal at first, but then you may proceed to your regular diet.  Drink plenty of fluids but you should avoid alcoholic beverages for 24 hours.  ACTIVITY:  You should plan to take it easy for the rest of today and you should NOT DRIVE or use heavy machinery until  tomorrow (because of the sedation medicines used during the test).    FOLLOW UP: Our staff will call the number listed on your records 48-72 hours following your procedure to check on you and address any questions or concerns that you may have regarding the information given to you following your procedure. If we do not reach you, we will leave a message.  We will attempt to reach you two times.  During this call, we will ask if you have developed any symptoms of COVID 19. If you develop any symptoms (ie: fever, flu-like symptoms, shortness of breath, cough etc.) before then, please call [phone].  If you test positive for Covid 19 in the 2 weeks post procedure, please call and report this information to [place].    If any biopsies were taken you will be contacted by phone or by letter within the next 1-3 weeks.  Please call us at [phone] if you have not heard about the biopsies in 3 weeks.    SIGNATURES/CONFIDENTIALITY: You and/or your care partner have signed paperwork which will be entered into your electronic medical record.  These signatures attest to the fact that that the information above on your After Visit Summary has been reviewed and is understood.  Full responsibility of the confidentiality of this discharge information lies with you and/or your care-partner.

## 2020-01-12 NOTE — Progress Notes (Signed)
PT taken to PACU. Monitors in place. VSS. Report given to RN. 

## 2020-01-12 NOTE — Progress Notes (Signed)
Called to room to assist during endoscopic procedure.  Patient ID and intended procedure confirmed with present staff. Received instructions for my participation in the procedure from the performing physician.  

## 2020-01-12 NOTE — Progress Notes (Signed)
Temp-JB VS-CW  Pt's states no medical or surgical changes since previsit or office visit.

## 2020-01-12 NOTE — Op Note (Signed)
[place] Patient Name: Matthew [name] Procedure Date: 01/12/2020 2:38 PM MRN: [id number] Endoscopist: Docia Chuck. [name] , MD Age: 54 Referring MD:  Date of Birth: November 28, 1966 Gender: Male Account #: [id number] Procedure:                Colonoscopy with biopsy; with cold snare                            polypectomy x 1 Indications:              High risk colon cancer surveillance: Longstanding                            indeterminate colitis. Felt to be ulcerative                            colitis. Evaluated at [facility] remotely for total                            colectomy. Underwent subtotal colectomy due to                            concerns for possible Crohn's disease. Has since                            been on a number of agents for his colitis. Most                            recently topical and oral mesalamine. Has undergone                            regular surveillance exams. Last surveillance exam                            2018. Now for follow-up Medicines:                Monitored Anesthesia Care Procedure:                Pre-Anesthesia Assessment:                           - Prior to the procedure, a History and Physical                            was performed, and patient medications and                            allergies were reviewed. The patient's tolerance of                            previous anesthesia was also reviewed. The risks                            and benefits of the procedure and the sedation  options and risks were discussed with the patient.                            All questions were answered, and informed consent                            was obtained. Prior Anticoagulants: The patient has                            taken no previous anticoagulant or antiplatelet                            agents. ASA Grade Assessment: III - A patient with                            severe systemic disease. After reviewing  the risks                            and benefits, the patient was deemed in                            satisfactory condition to undergo the procedure.                           After obtaining informed consent, the colonoscope                            was passed under direct vision. Throughout the                            procedure, the patient's blood pressure, pulse, and                            oxygen saturations were monitored continuously. The                            Colonoscope was introduced through the anus and                            advanced to the the cecum, identified by                            appendiceal orifice and ileocecal valve. The rectum                            was photographed. The quality of the bowel                            preparation was good. The colonoscopy was performed                            without difficulty. The patient tolerated the  procedure well. The bowel preparation used was                            SUPREP via split dose instruction. Scope In: 2:48:43 PM Scope Out: 2:59:57 PM Scope Withdrawal Time: 0 hours 9 minutes 47 seconds  Total Procedure Duration: 0 hours 11 minutes 14 seconds  Findings:                 A 4 mm polyp was found in the rectum. The polyp was                            removed with a cold snare. Resection and retrieval                            were complete.                           A diffuse area of mildly friable mucosa with                            contact bleeding was found in the sigmoid colon.                            Biopsies were taken with a cold forceps for                            histology.                           The exam was otherwise without abnormality on                            direct and retroflexion views. Complications:            No immediate complications. Estimated blood loss:                            None. Estimated Blood Loss:      Estimated blood loss: none. Impression:               - One 4 mm polyp in the rectum, removed with a cold                            snare. Resected and retrieved.                           - Friability with contact bleeding in the sigmoid                            colon. Biopsied.                           - The examination was otherwise normal on direct                            and retroflexion views. Recommendation:           -  Repeat colonoscopy date to be determined after                            pending pathology results are reviewed for                            surveillance.                           - Patient has a contact number available for                            emergencies. The signs and symptoms of potential                            delayed complications were discussed with the                            patient. Return to normal activities tomorrow.                            Written discharge instructions were provided to the                            patient.                           - Resume previous diet.                           - Continue present medications.                           - Await pathology results. Docia Chuck. [name], MD 01/12/2020 3:12:01 PM This report has been signed electronically.

## 2020-01-14 ENCOUNTER — Telehealth: Payer: Self-pay

## 2020-01-14 NOTE — Telephone Encounter (Signed)
  Follow up Call-  Call back number 01/12/2020 12/06/2017  Post procedure Call Back phone  # [phone]  Permission to leave phone message Yes No  Some recent data might be hidden     Patient questions:  Do you have a fever, pain , or abdominal swelling? No. Pain Score  0 *  Have you tolerated food without any problems? Yes.    Have you been able to return to your normal activities? Yes.    Do you have any questions about your discharge instructions: Diet   No. Medications  No. Follow up visit  No.  Do you have questions or concerns about your Care? No.  Actions: * If pain score is 4 or above: No action needed, pain <4. 1. Have you developed a fever since your procedure? no  2.   Have you had an respiratory symptoms (SOB or cough) since your procedure? no  3.   Have you tested positive for COVID 19 since your procedure no  4.   Have you had any family members/close contacts diagnosed with the COVID 19 since your procedure?  no   If yes to any of these questions please route to Joylene John, RN and Alphonsa Gin, Therapist, sports.

## 2020-01-15 ENCOUNTER — Encounter: Payer: Self-pay | Admitting: Internal Medicine

## 2020-01-28 ENCOUNTER — Other Ambulatory Visit: Payer: Self-pay

## 2020-01-28 MED ORDER — ZOLPIDEM TARTRATE 10 MG PO TABS: 10.0000 mg | ORAL_TABLET | Freq: Every evening | ORAL | 1 refills | Status: DC | PRN

## 2020-02-17 ENCOUNTER — Telehealth: Payer: Self-pay | Admitting: Internal Medicine

## 2020-02-17 ENCOUNTER — Other Ambulatory Visit: Payer: Self-pay | Admitting: Internal Medicine

## 2020-02-18 NOTE — Telephone Encounter (Signed)
Pharmacy changed

## 2020-03-24 ENCOUNTER — Telehealth: Payer: Self-pay | Admitting: Internal Medicine

## 2020-03-24 NOTE — Telephone Encounter (Signed)
[facility] Pentasa requires prior auth and the patient is already out

## 2020-03-25 MED ORDER — MESALAMINE ER 500 MG PO CPCR: ORAL_CAPSULE | ORAL | 2 refills | Status: DC

## 2020-03-25 NOTE — Telephone Encounter (Signed)
Received approval for Pentasa through Cover Meds - resent rx with this information.  Tried to call patient to let him know but home number didn't work and mobile had no voicemailbox

## 2020-04-05 ENCOUNTER — Telehealth: Payer: Self-pay | Admitting: Internal Medicine

## 2020-04-05 MED ORDER — ZOLPIDEM TARTRATE 10 MG PO TABS: 10.0000 mg | ORAL_TABLET | Freq: Every evening | ORAL | 1 refills | Status: DC | PRN

## 2020-04-05 NOTE — Telephone Encounter (Signed)
Pt requested a refill for Ambien.  Please [facility] [address] [facility] [place]. [place].

## 2020-04-05 NOTE — Telephone Encounter (Signed)
[facility] [place]

## 2020-04-13 ENCOUNTER — Other Ambulatory Visit: Payer: Self-pay | Admitting: Internal Medicine

## 2020-05-10 DIAGNOSIS — R351 Nocturia: Secondary | ICD-10-CM | POA: Insufficient documentation

## 2020-05-10 DIAGNOSIS — R3129 Other microscopic hematuria: Secondary | ICD-10-CM | POA: Insufficient documentation

## 2020-05-19 ENCOUNTER — Other Ambulatory Visit: Payer: Self-pay | Admitting: Internal Medicine

## 2020-06-02 ENCOUNTER — Other Ambulatory Visit: Payer: Self-pay | Admitting: Internal Medicine

## 2020-06-07 ENCOUNTER — Other Ambulatory Visit: Payer: Self-pay | Admitting: Internal Medicine

## 2020-06-07 ENCOUNTER — Telehealth: Payer: Self-pay | Admitting: Internal Medicine

## 2020-06-07 NOTE — Telephone Encounter (Signed)
Ambien refilled

## 2020-06-18 ENCOUNTER — Other Ambulatory Visit: Payer: Self-pay | Admitting: Internal Medicine

## 2020-07-18 ENCOUNTER — Other Ambulatory Visit: Payer: Self-pay | Admitting: Internal Medicine

## 2020-07-28 ENCOUNTER — Other Ambulatory Visit: Payer: Self-pay

## 2020-07-28 ENCOUNTER — Telehealth: Payer: Self-pay | Admitting: Internal Medicine

## 2020-07-28 MED ORDER — MESALAMINE 4 G RE ENEM: 4.0000 g | ENEMA | Freq: Every day | RECTAL | 2 refills | Status: DC

## 2020-07-28 NOTE — Telephone Encounter (Signed)
Quantity corrected for Rowasa enemas (note 60 ml is 1 (4 g) enema.

## 2020-07-28 NOTE — Telephone Encounter (Signed)
Pt called to inform that his pharmacy only gave him 1 box of Rowasa which contains 7 enemas. I see that the prescription was sent for 60 quantity so not sure of why his pharmacy only give him one week supply. Pt said that he asked his pharmacy and was told that we only sent prescription for that amount. Pls call pt's pharmacy.

## 2020-07-28 NOTE — Progress Notes (Signed)
Correction in # of enemas sent to pharmacy.  Should have been 60 ml PER enema or 1800 ml for 30 day supply.

## 2020-08-09 ENCOUNTER — Other Ambulatory Visit: Payer: Self-pay | Admitting: Internal Medicine

## 2020-10-04 ENCOUNTER — Other Ambulatory Visit: Payer: Self-pay | Admitting: Internal Medicine

## 2020-10-08 ENCOUNTER — Other Ambulatory Visit: Payer: Self-pay | Admitting: Internal Medicine

## 2020-10-12 ENCOUNTER — Telehealth: Payer: Self-pay | Admitting: Internal Medicine

## 2020-10-12 NOTE — Telephone Encounter (Signed)
Pt is requesting a refill on his Ambien.

## 2020-10-12 NOTE — Telephone Encounter (Signed)
Ambien refilled

## 2020-10-26 ENCOUNTER — Other Ambulatory Visit: Payer: Self-pay | Admitting: Internal Medicine

## 2020-11-09 ENCOUNTER — Other Ambulatory Visit: Payer: Self-pay | Admitting: Internal Medicine

## 2020-12-08 ENCOUNTER — Other Ambulatory Visit: Payer: Self-pay | Admitting: Internal Medicine

## 2021-01-21 ENCOUNTER — Other Ambulatory Visit: Payer: Self-pay | Admitting: Internal Medicine

## 2021-02-04 ENCOUNTER — Other Ambulatory Visit: Payer: Self-pay | Admitting: Internal Medicine

## 2021-02-07 ENCOUNTER — Other Ambulatory Visit: Payer: Self-pay | Admitting: Internal Medicine

## 2021-03-07 ENCOUNTER — Other Ambulatory Visit: Payer: Self-pay | Admitting: Internal Medicine

## 2021-03-10 DIAGNOSIS — E782 Mixed hyperlipidemia: Secondary | ICD-10-CM | POA: Insufficient documentation

## 2021-03-10 DIAGNOSIS — E119 Type 2 diabetes mellitus without complications: Secondary | ICD-10-CM | POA: Insufficient documentation

## 2021-04-04 ENCOUNTER — Other Ambulatory Visit: Payer: Self-pay | Admitting: Internal Medicine

## 2021-04-20 ENCOUNTER — Other Ambulatory Visit: Payer: Self-pay | Admitting: Internal Medicine

## 2021-05-30 ENCOUNTER — Other Ambulatory Visit: Payer: Self-pay | Admitting: Internal Medicine

## 2021-08-10 ENCOUNTER — Telehealth: Payer: Self-pay | Admitting: Internal Medicine

## 2021-08-11 MED ORDER — ZOLPIDEM TARTRATE 10 MG PO TABS: 10.0000 mg | ORAL_TABLET | Freq: Every day | ORAL | 1 refills | Status: DC

## 2021-08-11 NOTE — Telephone Encounter (Signed)
Refilled Ambien

## 2021-08-26 ENCOUNTER — Other Ambulatory Visit: Payer: Self-pay | Admitting: Internal Medicine

## 2021-08-26 ENCOUNTER — Other Ambulatory Visit: Payer: Self-pay

## 2021-08-26 MED ORDER — MESALAMINE ER 500 MG PO CPCR: ORAL_CAPSULE | ORAL | 1 refills | Status: DC

## 2021-10-11 ENCOUNTER — Other Ambulatory Visit: Payer: Self-pay | Admitting: Internal Medicine

## 2021-10-11 ENCOUNTER — Telehealth: Payer: Self-pay | Admitting: Internal Medicine

## 2021-10-11 MED ORDER — ZOLPIDEM TARTRATE 10 MG PO TABS: 10.0000 mg | ORAL_TABLET | Freq: Every day | ORAL | 0 refills | Status: AC

## 2021-10-11 NOTE — Telephone Encounter (Signed)
Okay to refill for Matthew [name].  Thank you

## 2021-10-11 NOTE — Telephone Encounter (Signed)
I have [facility] [place] [facility] patient request.

## 2021-10-11 NOTE — Telephone Encounter (Signed)
Dr [name], please advise...  Patient requesting Ambien refill until 11/29/21 appointment with you. It appears his last office visit was on 12/02/18 and his last procedure was on 01/12/20. It also appears we have been prescribing Ambien since around 2013 for him.  OK to continue filling until his appointment with you?

## 2021-10-11 NOTE — Telephone Encounter (Signed)
Inbound call from patient requesting medication refill for Zolpidem. Have an appt scheduled for 12/6

## 2021-10-12 ENCOUNTER — Other Ambulatory Visit: Payer: Self-pay | Admitting: Internal Medicine

## 2021-10-12 DIAGNOSIS — E041 Nontoxic single thyroid nodule: Secondary | ICD-10-CM | POA: Insufficient documentation

## 2021-10-13 NOTE — Telephone Encounter (Signed)
[facility], [facility] said [facility] did not [facility] the [facility].   So I called in the script verbally will call pt back to inform

## 2021-10-13 NOTE — Telephone Encounter (Signed)
[facility] called [facility] [place] [facility] not [facility] he also requested to have the pharmacy in [place] removed from his file.

## 2021-11-15 DIAGNOSIS — G250 Essential tremor: Secondary | ICD-10-CM | POA: Insufficient documentation

## 2021-11-15 DIAGNOSIS — F5101 Primary insomnia: Secondary | ICD-10-CM | POA: Insufficient documentation

## 2021-11-29 ENCOUNTER — Ambulatory Visit (INDEPENDENT_AMBULATORY_CARE_PROVIDER_SITE_OTHER): Payer: Medicare HMO | Admitting: Internal Medicine

## 2021-11-29 ENCOUNTER — Encounter: Payer: Self-pay | Admitting: Internal Medicine

## 2021-11-29 VITALS — BP 140/70 | HR 56 | Ht 70.0 in | Wt 278.0 lb

## 2021-11-29 DIAGNOSIS — K5190 Ulcerative colitis, unspecified, without complications: Secondary | ICD-10-CM

## 2021-11-29 MED ORDER — MESALAMINE ER 500 MG PO CPCR: ORAL_CAPSULE | ORAL | 3 refills | Status: DC

## 2021-11-29 MED ORDER — SUTAB 1479-225-188 MG PO TABS: 1.0000 | ORAL_TABLET | Freq: Once | ORAL | 0 refills | Status: AC

## 2021-11-29 MED ORDER — MESALAMINE 4 G RE ENEM: 4.0000 g | ENEMA | Freq: Every day | RECTAL | 3 refills | Status: DC

## 2021-11-29 NOTE — Progress Notes (Signed)
HISTORY OF PRESENT ILLNESS:  Matthew [name] is a 55 y.o. 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  Status post subtotal colectomy with residual 40 cm of colon and rectum remaining (general surgeon at [facility] was concerned about Crohn's disease).  He is intolerant to immunomodulators.  He has been on multiple courses of steroids, both oral and topical.  He was last seen in the office December 02, 2018.  He was taken off of Humira by his dermatologist for drug-related eczema.  He has been maintained on Pentasa 2 g twice daily and Rowasa enemas once at night.  His last surveillance colonoscopy was performed January 2021.  He was found to have mildly active chronic colitis.  Inflammatory polyps but no neoplasia.  Follow-up in 2 years recommended.  Patient tells me that he was doing well until October when he had a flare of disease that lasted about 1 week.  The flare of disease was manifested by increased frequency of bowel movements and bleeding.  He continued on his therapies throughout.  He is back to his baseline of 4-6 bowel movements per day.  He does take Imodium.  No further bleeding or abdominal pain.  He presents today for routine follow-up, medication refill, and to schedule his surveillance examination.  I was sorry to hear that his father passed from cancer since our last visit.  REVIEW OF SYSTEMS:  All non-GI ROS negative unless otherwise stated in the HPI except for visual disturbance  Past Medical History:  Diagnosis Date   Colon polyps    2007   Diabetes ([place])    Glaucoma    History of blood transfusion    2001   HTN (hypertension)    Hyperlipidemia    Inflammatory bowel disease    Primary generalized (osteo)arthritis    Ulcerative colitis, unspecified     Past Surgical History:  Procedure Laterality Date   COLON RESECTION  2001   COLONOSCOPY  2018   cornea transplant both eyes Bilateral    left, 2004; right 2005   EYE SURGERY      laser OS April 2014   GLAUCOMA SURGERY Left 2014   VENTRAL HERNIA REPAIR  2002    Social History ICHAEL PULLARA  reports that he has never smoked. He has never used smokeless tobacco. He reports that he does not drink alcohol and does not use drugs.  family history includes Breast cancer in his cousin; Colitis in his father; Colon cancer in his maternal grandfather; Colon polyps in his father and mother; Heart disease in his father and paternal grandmother; Liver cancer in his maternal grandfather; Lung cancer in his father; Ovarian cancer in his mother; Prostate cancer in his cousin.  Allergies  Allergen Reactions   Contrast Media [Iodinated Diagnostic Agents] Hives   Dimetapp Allergy Sinus [Brompheniramine-Ppa-Apap] Hives   Doxycycline Other (See Comments)    Per patient   Sulfamethoxazole Other (See Comments)    May have had conjunctivitis from sulfa eye drops       PHYSICAL EXAMINATION: Vital signs: BP 140/70   Pulse (!) 56   Ht 5' 10"  (1.778 m)   Wt 278 lb (126.1 kg)   BMI 39.89 kg/m   Constitutional: Overweight but generally well-appearing, no acute distress Psychiatric: alert and oriented x3, cooperative Eyes: Anicteric.  Irregular pupil on the left Mouth: oral pharynx moist, no lesions Neck: supple no lymphadenopathy Cardiovascular: heart regular rate and rhythm, no murmur Lungs: clear to auscultation  bilaterally Abdomen: soft, nontender, nondistended, no obvious ascites, no peritoneal signs, normal bowel sounds, no organomegaly.  Previous surgical incision well-healed Rectal: Deferred until colonoscopy Extremities: no clubbing or cyanosis.  Trace lower extremity edema bilaterally Skin: no lesions on visible extremities Neuro: No focal deficits.  Cranial nerves intact  ASSESSMENT:  1.  History of indeterminate colitis status post subtotal colectomy.  Recent flare of disease as described.  Currently asymptomatic on a combination of oral and topical  mesalamine.  Intolerant to immunomodulators and anti-TNF agents as previously outlined 2.  Last colonoscopy January 2021.  Due for surveillance 3.  General medical problems   PLAN:  1.  Refill Pentasa.  Medication risks reviewed 2.  Refill Rowasa enemas.  Medication risks reviewed 3.  Schedule surveillance colonoscopies with biopsies.The nature of the procedure, as well as the risks, benefits, and alternatives were carefully and thoroughly reviewed with the patient. Ample time for discussion and questions allowed. The patient understood, was satisfied, and agreed to proceed. 4.  Ongoing general medical care with PCP

## 2021-11-29 NOTE — Patient Instructions (Addendum)
If you are age 56 or older, your body mass index should be between 23-30. Your Body mass index is 39.89 kg/m. If this is out of the aforementioned range listed, please consider follow up with your Primary Care Provider.  If you are age 51 or younger, your body mass index should be between 19-25. Your Body mass index is 39.89 kg/m. If this is out of the aformentioned range listed, please consider follow up with your Primary Care Provider.   ________________________________________________________  The [place] [facility] providers would like to encourage you to use [facility] to communicate with providers for non-urgent requests or questions.  Due to long hold times on the telephone, sending your provider a message by [facility] may be a faster and more efficient way to get a response.  Please allow 48 business hours for a response.  Please remember that this is for non-urgent requests.  _______________________________________________________  We have sent the following medications to your pharmacy for you to pick up at your convenience:  Pentasa, Rowasa   You have been scheduled for a colonoscopy. Please follow written instructions given to you at your visit today.  Please pick up your prep supplies at the pharmacy within the next 1-3 days. If you use inhalers (even only as needed), please bring them with you on the day of your procedure.

## 2022-01-11 ENCOUNTER — Other Ambulatory Visit: Payer: Self-pay

## 2022-01-11 ENCOUNTER — Encounter: Payer: Self-pay | Admitting: Internal Medicine

## 2022-01-11 ENCOUNTER — Ambulatory Visit (AMBULATORY_SURGERY_CENTER): Payer: Medicare HMO | Admitting: Internal Medicine

## 2022-01-11 VITALS — BP 114/61 | HR 75 | Temp 98.0°F | Resp 14 | Ht 70.0 in | Wt 278.0 lb

## 2022-01-11 DIAGNOSIS — K5190 Ulcerative colitis, unspecified, without complications: Secondary | ICD-10-CM

## 2022-01-11 DIAGNOSIS — Z86010 Personal history of colonic polyps: Secondary | ICD-10-CM

## 2022-01-11 DIAGNOSIS — D125 Benign neoplasm of sigmoid colon: Secondary | ICD-10-CM

## 2022-01-11 DIAGNOSIS — K5150 Left sided colitis without complications: Secondary | ICD-10-CM | POA: Diagnosis not present

## 2022-01-11 DIAGNOSIS — K5140 Inflammatory polyps of colon without complications: Secondary | ICD-10-CM | POA: Diagnosis not present

## 2022-01-11 MED ORDER — SODIUM CHLORIDE 0.9 % IV SOLN: 500.0000 mL | Freq: Once | INTRAVENOUS | Status: DC

## 2022-01-11 NOTE — Progress Notes (Signed)
Called to room to assist during endoscopic procedure.  Patient ID and intended procedure confirmed with present staff. Received instructions for my participation in the procedure from the performing physician.  

## 2022-01-11 NOTE — Progress Notes (Signed)
VS by ACR  Some swelling and redness noted to left eye, patient states it is from an old injury. SChaplin, RN,BSN

## 2022-01-11 NOTE — Progress Notes (Signed)
Sedate, gd SR, tolerated procedure well, VSS, report to RN 

## 2022-01-11 NOTE — Progress Notes (Signed)
HISTORY OF PRESENT ILLNESS:   Matthew [name] is a 56 y.o. 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  Status post subtotal colectomy with residual 40 cm of colon and rectum remaining (general surgeon at [facility] was concerned about Crohn's disease).  He is intolerant to immunomodulators.  He has been on multiple courses of steroids, both oral and topical.  He was last seen in the office December 02, 2018.  He was taken off of Humira by his dermatologist for drug-related eczema.  He has been maintained on Pentasa 2 g twice daily and Rowasa enemas once at night.  His last surveillance colonoscopy was performed January 2021.  He was found to have mildly active chronic colitis.  Inflammatory polyps but no neoplasia.  Follow-up in 2 years recommended.  Patient tells me that he was doing well until October when he had a flare of disease that lasted about 1 week.  The flare of disease was manifested by increased frequency of bowel movements and bleeding.  He continued on his therapies throughout.  He is back to his baseline of 4-6 bowel movements per day.  He does take Imodium.  No further bleeding or abdominal pain.  He presents today for routine follow-up, medication refill, and to schedule his surveillance examination.  I was sorry to hear that his father passed from cancer since our last visit.   REVIEW OF SYSTEMS:   All non-GI ROS negative unless otherwise stated in the HPI except for visual disturbance       Past Medical History:  Diagnosis Date   Colon polyps      2007   Diabetes ([place])     Glaucoma     History of blood transfusion      2001   HTN (hypertension)     Hyperlipidemia     Inflammatory bowel disease     Primary generalized (osteo)arthritis     Ulcerative colitis, unspecified             Past Surgical History:  Procedure Laterality Date   COLON RESECTION   2001   COLONOSCOPY   2018   cornea transplant both eyes Bilateral      left,  2004; right 2005   EYE SURGERY        laser OS April 2014   GLAUCOMA SURGERY Left 2014   VENTRAL HERNIA REPAIR   2002      Social History SAAHAS HIDROGO  reports that he has never smoked. He has never used smokeless tobacco. He reports that he does not drink alcohol and does not use drugs.   family history includes Breast cancer in his cousin; Colitis in his father; Colon cancer in his maternal grandfather; Colon polyps in his father and mother; Heart disease in his father and paternal grandmother; Liver cancer in his maternal grandfather; Lung cancer in his father; Ovarian cancer in his mother; Prostate cancer in his cousin.        Allergies  Allergen Reactions   Contrast Media [Iodinated Diagnostic Agents] Hives   Dimetapp Allergy Sinus [Brompheniramine-Ppa-Apap] Hives   Doxycycline Other (See Comments)      Per patient   Sulfamethoxazole Other (See Comments)      May have had conjunctivitis from sulfa eye drops          PHYSICAL EXAMINATION: Vital signs: BP 140/70    Pulse (!) 56    Ht 5' 10"  (1.778 m)    Wt 278 lb (  126.1 kg)    BMI 39.89 kg/m   Constitutional: Overweight but generally well-appearing, no acute distress Psychiatric: alert and oriented x3, cooperative Eyes: Anicteric.  Irregular pupil on the left Mouth: oral pharynx moist, no lesions Neck: supple no lymphadenopathy Cardiovascular: heart regular rate and rhythm, no murmur Lungs: clear to auscultation bilaterally Abdomen: soft, nontender, nondistended, no obvious ascites, no peritoneal signs, normal bowel sounds, no organomegaly.  Previous surgical incision well-healed Rectal: Deferred until colonoscopy Extremities: no clubbing or cyanosis.  Trace lower extremity edema bilaterally Skin: no lesions on visible extremities Neuro: No focal deficits.  Cranial nerves intact   ASSESSMENT:   1.  History of indeterminate colitis status post subtotal colectomy.  Recent flare of disease as described.  Currently  asymptomatic on a combination of oral and topical mesalamine.  Intolerant to immunomodulators and anti-TNF agents as previously outlined 2.  Last colonoscopy January 2021.  Due for surveillance 3.  General medical problems     PLAN:   1.  Refill Pentasa.  Medication risks reviewed 2.  Refill Rowasa enemas.  Medication risks reviewed 3.  Schedule surveillance colonoscopies with biopsies.The nature of the procedure, as well as the risks, benefits, and alternatives were carefully and thoroughly reviewed with the patient. Ample time for discussion and questions allowed. The patient understood, was satisfied, and agreed to proceed. 4.  Ongoing general medical care with PCP  No interval change

## 2022-01-11 NOTE — Patient Instructions (Signed)
Information on polyps given to you today.  Await pathology results.  Repeat colonoscopy in 2 years for surveillance.  Resume previous diet and medications.   YOU HAD AN ENDOSCOPIC PROCEDURE TODAY AT [facility]:   Refer to the procedure report that was given to you for any specific questions about what was found during the examination.  If the procedure report does not answer your questions, please call your gastroenterologist to clarify.  If you requested that your care partner not be given the details of your procedure findings, then the procedure report has been included in a sealed envelope for you to review at your convenience later.  YOU SHOULD EXPECT: Some feelings of bloating in the abdomen. Passage of more gas than usual.  Walking can help get rid of the air that was put into your GI tract during the procedure and reduce the bloating. If you had a lower endoscopy (such as a colonoscopy or flexible sigmoidoscopy) you may notice spotting of blood in your stool or on the toilet paper. If you underwent a bowel prep for your procedure, you may not have a normal bowel movement for a few days.  Please Note:  You might notice some irritation and congestion in your nose or some drainage.  This is from the oxygen used during your procedure.  There is no need for concern and it should clear up in a day or so.  SYMPTOMS TO REPORT IMMEDIATELY:  Following lower endoscopy (colonoscopy or flexible sigmoidoscopy):  Excessive amounts of blood in the stool  Significant tenderness or worsening of abdominal pains  Swelling of the abdomen that is new, acute  Fever of 100F or higher   For urgent or emergent issues, a gastroenterologist can be reached at any hour by calling [phone]. Do not use MyChart messaging for urgent concerns.    DIET:  We do recommend a small meal at first, but then you may proceed to your regular diet.  Drink plenty of fluids but you should avoid  alcoholic beverages for 24 hours.  ACTIVITY:  You should plan to take it easy for the rest of today and you should NOT DRIVE or use heavy machinery until tomorrow (because of the sedation medicines used during the test).    FOLLOW UP: Our staff will call the number listed on your records 48-72 hours following your procedure to check on you and address any questions or concerns that you may have regarding the information given to you following your procedure. If we do not reach you, we will leave a message.  We will attempt to reach you two times.  During this call, we will ask if you have developed any symptoms of COVID 19. If you develop any symptoms (ie: fever, flu-like symptoms, shortness of breath, cough etc.) before then, please call [phone].  If you test positive for Covid 19 in the 2 weeks post procedure, please call and report this information to [place].    If any biopsies were taken you will be contacted by phone or by letter within the next 1-3 weeks.  Please call us at [phone] if you have not heard about the biopsies in 3 weeks.    SIGNATURES/CONFIDENTIALITY: You and/or your care partner have signed paperwork which will be entered into your electronic medical record.  These signatures attest to the fact that that the information above on your After Visit Summary has been reviewed and is understood.  Full responsibility of the confidentiality of this  discharge information lies with you and/or your care-partner.

## 2022-01-11 NOTE — Op Note (Signed)
[facility] Patient Name: Matthew [name] Procedure Date: 01/11/2022 3:21 PM MRN: [id number] Endoscopist: Docia Chuck. 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 Last examination                            January 2021 Medicines:                Monitored Anesthesia Care Procedure:                Pre-Anesthesia Assessment:                           - Prior to the procedure, a History and Physical                            was performed, and patient medications and                            allergies were reviewed. The patient's tolerance of                            previous anesthesia was also reviewed. The risks                            and benefits of the procedure and the sedation                            options and risks were discussed with the patient.                            All questions were answered, and informed consent                            was obtained. Prior Anticoagulants: The patient has                            taken no previous anticoagulant or antiplatelet                            agents. ASA Grade Assessment: III - A patient with  severe systemic disease. After reviewing the risks                            and benefits, the patient was deemed in                            satisfactory condition to undergo the procedure.                           After obtaining informed consent, the colonoscope                            was passed under direct vision. Throughout the                            procedure, the patient's blood pressure, pulse, and                             oxygen saturations were monitored continuously. The                            CF [id number] #[id number] was introduced through the anus                            and advanced to the the ileocolonic anastomosis.                            The rectum was photographed. The quality of the                            bowel preparation was excellent. The colonoscopy                            was performed without difficulty. The patient                            tolerated the procedure well. The bowel preparation                            used was SUPREP via split dose instruction. Scope In: 3:38:01 PM Scope Out: 3:47:29 PM Scope Withdrawal Time: 0 hours 7 minutes 21 seconds  Total Procedure Duration: 0 hours 9 minutes 28 seconds  Findings:                 A 5 mm polyp was found in the sigmoid colon. The                            polyp was sessile. The polyp was removed with a                            cold snare. Resection and retrieval were complete.                           The colon  extended to a distance of 40 cm. The                            anastomosis was unremarkable. The new ileum was                            normal. There was patchy erythema scattered                            throughout the remaining left colon. Surveillance                            biopsies were taken with a cold forceps for                            histology. Retroflexed view was unremarkable. Complications:            No immediate complications. Estimated blood loss:                            None. Estimated Blood Loss:     Estimated blood loss: none. Impression:               - One 5 mm polyp in the sigmoid colon, removed with                            a cold snare. Resected and retrieved.                           -Status post subtotal colectomy. Normal needle ileum                           -Patchy erythema of the colonic mucosa status post                             biopsies. Recommendation:           - Repeat colonoscopy in 2 years for surveillance.                           - Patient has a contact number available for                            emergencies. The signs and symptoms of potential                            delayed complications were discussed with the                            patient. Return to normal activities tomorrow.                            Written discharge instructions were provided to the  patient.                           - Resume previous diet.                           - Continue present medications.                           - Await pathology results.                           -Routine office follow-up 1 year Samanth Mirkin N. [name], MD 01/11/2022 3:57:11 PM This report has been signed electronically.

## 2022-01-13 ENCOUNTER — Telehealth: Payer: Self-pay | Admitting: *Deleted

## 2022-01-13 NOTE — Telephone Encounter (Signed)
°  Follow up Call-  Call back number 01/11/2022 01/12/2020  Post procedure Call Back phone  # [phone]  Permission to leave phone message No Yes  Some recent data might be hidden     Patient questions:  Do you have a fever, pain , or abdominal swelling? No. Pain Score  0 *  Have you tolerated food without any problems? Yes.    Have you been able to return to your normal activities? Yes.    Do you have any questions about your discharge instructions: Diet   No. Medications  No. Follow up visit  No.  Do you have questions or concerns about your Care? No.  Actions: * If pain score is 4 or above: No action needed, pain <4.  Have you developed a fever since your procedure? no  2.   Have you had an respiratory symptoms (SOB or cough) since your procedure? no  3.   Have you tested positive for COVID 19 since your procedure no  4.   Have you had any family members/close contacts diagnosed with the COVID 19 since your procedure?  no   If yes to any of these questions please route to Joylene John, RN and Joella Prince, RN

## 2022-01-13 NOTE — Telephone Encounter (Signed)
First follow up call attempt.  Unable to leave a message.

## 2022-01-17 ENCOUNTER — Encounter: Payer: Self-pay | Admitting: Internal Medicine

## 2022-02-17 ENCOUNTER — Other Ambulatory Visit: Payer: Self-pay | Admitting: Internal Medicine

## 2022-03-23 ENCOUNTER — Telehealth: Payer: Self-pay | Admitting: Internal Medicine

## 2022-03-23 NOTE — Telephone Encounter (Signed)
Inbound call from patient stating that he needs a pre authorization for Pentasa. Please advise.   ?

## 2022-03-29 ENCOUNTER — Other Ambulatory Visit: Payer: Self-pay | Admitting: Internal Medicine

## 2022-03-29 MED ORDER — MESALAMINE ER 500 MG PO CPCR: ORAL_CAPSULE | ORAL | 3 refills | Status: DC

## 2022-03-29 NOTE — Telephone Encounter (Signed)
Pentasa approved - resent to pharmacy with approval information ?

## 2022-03-29 NOTE — Telephone Encounter (Signed)
PA for Pentasa sent to Cover My Meds.  Awaiting response ?

## 2022-06-23 ENCOUNTER — Other Ambulatory Visit: Payer: Self-pay | Admitting: Internal Medicine

## 2022-10-19 ENCOUNTER — Other Ambulatory Visit: Payer: Self-pay | Admitting: Internal Medicine

## 2023-01-18 ENCOUNTER — Encounter: Payer: Self-pay | Admitting: Internal Medicine

## 2023-01-20 ENCOUNTER — Other Ambulatory Visit: Payer: Self-pay | Admitting: Internal Medicine

## 2023-01-22 ENCOUNTER — Other Ambulatory Visit: Payer: Self-pay | Admitting: Internal Medicine

## 2023-02-18 ENCOUNTER — Other Ambulatory Visit: Payer: Self-pay | Admitting: Internal Medicine

## 2023-02-20 DIAGNOSIS — B353 Tinea pedis: Secondary | ICD-10-CM | POA: Insufficient documentation

## 2023-02-22 DIAGNOSIS — E1122 Type 2 diabetes mellitus with diabetic chronic kidney disease: Secondary | ICD-10-CM | POA: Insufficient documentation

## 2023-02-22 DIAGNOSIS — N182 Chronic kidney disease, stage 2 (mild): Secondary | ICD-10-CM | POA: Insufficient documentation

## 2023-02-22 DIAGNOSIS — I129 Hypertensive chronic kidney disease with stage 1 through stage 4 chronic kidney disease, or unspecified chronic kidney disease: Secondary | ICD-10-CM | POA: Insufficient documentation

## 2023-03-16 ENCOUNTER — Other Ambulatory Visit: Payer: Self-pay | Admitting: Internal Medicine

## 2023-04-09 ENCOUNTER — Ambulatory Visit: Payer: Medicare HMO | Admitting: Internal Medicine

## 2023-04-09 ENCOUNTER — Encounter: Payer: Self-pay | Admitting: Internal Medicine

## 2023-04-09 ENCOUNTER — Other Ambulatory Visit: Payer: Self-pay | Admitting: Internal Medicine

## 2023-04-09 VITALS — BP 138/78 | HR 68 | Ht 70.0 in | Wt 280.0 lb

## 2023-04-09 DIAGNOSIS — K51819 Other ulcerative colitis with unspecified complications: Secondary | ICD-10-CM | POA: Diagnosis not present

## 2023-04-09 MED ORDER — MESALAMINE 4 G RE ENEM: 4.0000 g | ENEMA | Freq: Every day | RECTAL | 11 refills | Status: DC

## 2023-04-09 MED ORDER — MESALAMINE ER 500 MG PO CPCR: ORAL_CAPSULE | ORAL | 3 refills | Status: DC

## 2023-04-09 NOTE — Patient Instructions (Addendum)
We have refilled your ROWASA and PENTASA   _______________________________________________________  If your blood pressure at your visit was 140/90 or greater, please contact your primary care physician to follow up on this.  _______________________________________________________  If you are age 58 or older, your body mass index should be between 23-30. Your Body mass index is 40.18 kg/m. If this is out of the aforementioned range listed, please consider follow up with your Primary Care Provider.  If you are age 58 or younger, your body mass index should be between 19-25. Your Body mass index is 40.18 kg/m. If this is out of the aformentioned range listed, please consider follow up with your Primary Care Provider.   ________________________________________________________  The [place] [facility] providers would like to encourage you to use [facility] to communicate with providers for non-urgent requests or questions.  Due to long hold times on the telephone, sending your provider a message by [facility] may be a faster and more efficient way to get a response.  Please allow 48 business hours for a response.  Please remember that this is for non-urgent requests.  _______________________________________________________ It was a pleasure to see you today!  Thank you for trusting me with your gastrointestinal care!

## 2023-04-09 NOTE — Telephone Encounter (Signed)
PA for Pentasa

## 2023-04-09 NOTE — Progress Notes (Signed)
HISTORY OF PRESENT ILLNESS:  Matthew [name] is a 57 y.o. 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  Status post subtotal colectomy with residual 40 cm of colon and rectum remaining (general surgeon at [facility] was concerned about Crohn's disease).  He is intolerant to immunomodulators.  Has been on multiple courses of steroids, both oral and topical.  He had previously been on Humira but this was discontinued due to drug related eczema.  He was last seen in the office November 29, 2021.  See that dictation.  He remains on Pentasa 2 g twice daily and Rowasa enemas at night.  He uses Imodium regularly.  Sometimes has difficulty finding this at the pharmacy or store.  Last underwent surveillance colonoscopy January 2003.  He was found to have an inflammatory polyp which was removed.  As well patchy mildly active chronic colitis without dysplasia.  Office follow-up at this time recommended.  Repeat colonoscopy in 2 years from previous exam also recommended.  He is pleased to tell me that he is doing well.  He is at his baseline of 4-6 bowel movements per day.  No incontinence.  No bleeding or abdominal pain.  He does request medication refill.  He is concerned about his insurance companies benefit with regards to his medication.  Requesting some sort of letter.  REVIEW OF SYSTEMS:  All non-GI ROS negative unless otherwise stated in the HPI except for arthritis, sleeping problems  Past Medical History:  Diagnosis Date   Colon polyps    2007   Diabetes    Glaucoma    History of blood transfusion    2001   HTN (hypertension)    Hyperlipidemia    Inflammatory bowel disease    Primary generalized (osteo)arthritis    Ulcerative colitis, unspecified     Past Surgical History:  Procedure Laterality Date   COLON RESECTION  2001   COLONOSCOPY  2018   cornea transplant both eyes Bilateral    left, 2004; right 2005   EYE SURGERY     laser OS April 2014    GLAUCOMA SURGERY Left 2014   VENTRAL HERNIA REPAIR  2002    Social History Matthew [name]  reports that he has never smoked. He has never used smokeless tobacco. He reports that he does not drink alcohol and does not use drugs.  family history includes Breast cancer in his cousin; Colitis in his father; Colon cancer in his maternal grandfather; Colon polyps in his father and mother; Heart disease in his father and paternal grandmother; Liver cancer in his maternal grandfather; Lung cancer in his father; Ovarian cancer in his mother; Prostate cancer in his cousin.  Allergies  Allergen Reactions   Contrast Media [Iodinated Contrast Media] Hives   Dimetapp Allergy Sinus [Brompheniramine-Ppa-Apap] Hives   Doxycycline Other (See Comments)    Per patient   Sulfamethoxazole Other (See Comments)    May have had conjunctivitis from sulfa eye drops       PHYSICAL EXAMINATION: Vital signs: BP 138/78   Pulse 68   Ht  (1.778 m)   Wt 280 lb (127 kg)   BMI 40.18 kg/m   Constitutional: generally well-appearing, no acute distress Psychiatric: alert and oriented x3, cooperative Eyes: Anicteric Mouth: oral pharynx moist, no lesions Neck: supple no lymphadenopathy Cardiovascular: heart regular rate and rhythm, no murmur Lungs: clear to auscultation bilaterally Abdomen: soft, obese, nontender, nondistended, no obvious ascites, no peritoneal signs, normal bowel sounds, no  organomegaly.  Previous surgical incisions well-healed Rectal: Omitted Extremities: no clubbing or cyanosis.  1+ lower extremity edema bilaterally Skin: no lesions on visible extremities Neuro: No focal deficits.  Cranial nerves intact  ASSESSMENT:   1.  History of indeterminate colitis status post subtotal colectomy.  No recent flares.  Currently asymptomatic on a combination of oral and topical mesalamine.  Intolerant to immunomodulators and anti-TNF agents as previously outlined 2.  Last colonoscopy January 2023.   Due for surveillance January 2025 3.  General medical problems.  Stable     PLAN:   1.  Refill Pentasa.  Medication risks reviewed 2.  Refill Rowasa enemas.  Medication risks reviewed 3.  Continue Imodium as needed 4.  Routine office follow-up 1 year 5.  Ongoing general medical care with PCP A total time of 30 minutes was spent preparing to see the patient, obtaining interval history, performing medically appropriate physical exam, counseling educating the patient regarding the above listed issues, ordering medication, defining follow-up interval, and documenting clinical information in the health record.

## 2023-04-17 ENCOUNTER — Telehealth: Payer: Self-pay

## 2023-04-17 ENCOUNTER — Other Ambulatory Visit (HOSPITAL_COMMUNITY): Payer: Self-pay

## 2023-04-17 MED ORDER — MESALAMINE ER 500 MG PO CPCR: ORAL_CAPSULE | ORAL | 3 refills | Status: DC

## 2023-04-17 NOTE — Telephone Encounter (Signed)
PA request received via provider for Pentasa  er capsules  PA has been submitted to [facility] via [facility] and has been APPROVED through 12/25/2023  Key: [id number]

## 2023-04-17 NOTE — Telephone Encounter (Signed)
PA has been submitted and approved.  

## 2023-04-17 NOTE — Addendum Note (Signed)
Addended by: Jeanine Luz on: 04/17/2023 10:44 AM   Modules accepted: Orders

## 2024-01-12 ENCOUNTER — Other Ambulatory Visit: Payer: Self-pay | Admitting: Internal Medicine

## 2024-01-14 ENCOUNTER — Other Ambulatory Visit (HOSPITAL_COMMUNITY): Payer: Self-pay

## 2024-01-14 ENCOUNTER — Telehealth: Payer: Self-pay

## 2024-01-14 NOTE — Telephone Encounter (Signed)
PA request has been Submitted. New Encounter created for follow up. For additional info see Pharmacy Prior Auth telephone encounter from 01-04-2024.

## 2024-01-14 NOTE — Telephone Encounter (Signed)
Pharmacy Patient Advocate Encounter   Received notification from RX Request Messages that prior authorization for Pentasa 500MG  er capsules is required/requested.   Insurance verification completed.   The patient is insured through [facility] [employer].   Per test claim: PA required; PA submitted to above mentioned insurance via CoverMyMeds Key/confirmation #/EOC [id number] Status is pending

## 2024-01-14 NOTE — Telephone Encounter (Signed)
Pharmacy sent message that Pentasa was not on formulary so I think it needs a PA

## 2024-01-15 NOTE — Telephone Encounter (Signed)
Pharmacy Patient Advocate Encounter  Received notification from [employer] [facility] [employer] that Prior Authorization for Pentasa 500MG  er capsules has been APPROVED from 01-14-2024 to 12-24-2024   PA #/Case ID/Reference #: [id number]

## 2024-01-17 ENCOUNTER — Other Ambulatory Visit: Payer: Self-pay | Admitting: Internal Medicine

## 2024-03-07 DIAGNOSIS — E118 Type 2 diabetes mellitus with unspecified complications: Secondary | ICD-10-CM | POA: Insufficient documentation

## 2024-03-26 ENCOUNTER — Encounter: Payer: Self-pay | Admitting: Internal Medicine

## 2024-05-29 ENCOUNTER — Encounter: Payer: Self-pay | Admitting: Internal Medicine

## 2024-05-29 ENCOUNTER — Ambulatory Visit (AMBULATORY_SURGERY_CENTER)

## 2024-05-29 VITALS — Ht 70.0 in | Wt 274.0 lb

## 2024-05-29 DIAGNOSIS — K51919 Ulcerative colitis, unspecified with unspecified complications: Secondary | ICD-10-CM

## 2024-05-29 DIAGNOSIS — Z860100 Personal history of colon polyps, unspecified: Secondary | ICD-10-CM

## 2024-05-29 MED ORDER — NA SULFATE-K SULFATE-MG SULF 17.5-3.13-1.6 GM/177ML PO SOLN: 1.0000 | Freq: Once | ORAL | 0 refills | Status: AC

## 2024-05-29 NOTE — Progress Notes (Signed)
 No egg or soy allergy known to patient  No issues known to pt with past sedation with any surgeries or procedures Patient denies ever being told they had issues or difficulty with intubation  No FH of Malignant Hyperthermia Pt is not on diet pills Pt is not on  home 02  Pt is not on blood thinners  Pt denies issues with constipation  No A fib or A flutter Have any cardiac testing pending--no Pt instructed to use Singlecare.[email or web address] or GoodRx for a price reduction on prep  Ambulates independently

## 2024-06-12 ENCOUNTER — Encounter: Payer: Self-pay | Admitting: Internal Medicine

## 2024-06-12 ENCOUNTER — Ambulatory Visit: Admitting: Internal Medicine

## 2024-06-12 VITALS — BP 103/60 | HR 79 | Temp 98.1°F | Resp 20 | Ht 70.0 in | Wt 274.0 lb

## 2024-06-12 DIAGNOSIS — Z980 Intestinal bypass and anastomosis status: Secondary | ICD-10-CM | POA: Diagnosis not present

## 2024-06-12 DIAGNOSIS — C186 Malignant neoplasm of descending colon: Secondary | ICD-10-CM

## 2024-06-12 DIAGNOSIS — Z860100 Personal history of colon polyps, unspecified: Secondary | ICD-10-CM

## 2024-06-12 DIAGNOSIS — K5140 Inflammatory polyps of colon without complications: Secondary | ICD-10-CM

## 2024-06-12 DIAGNOSIS — Z1211 Encounter for screening for malignant neoplasm of colon: Secondary | ICD-10-CM

## 2024-06-12 DIAGNOSIS — K51919 Ulcerative colitis, unspecified with unspecified complications: Secondary | ICD-10-CM

## 2024-06-12 DIAGNOSIS — D124 Benign neoplasm of descending colon: Secondary | ICD-10-CM

## 2024-06-12 DIAGNOSIS — Z8719 Personal history of other diseases of the digestive system: Secondary | ICD-10-CM

## 2024-06-12 MED ORDER — SODIUM CHLORIDE 0.9 % IV SOLN: 500.0000 mL | Freq: Once | INTRAVENOUS | Status: DC

## 2024-06-12 NOTE — Progress Notes (Signed)
 To pacu, VSS. Report to [email or web address]

## 2024-06-12 NOTE — Progress Notes (Unsigned)
 Expand All Collapse All HISTORY OF PRESENT ILLNESS:   Matthew [name] is a 58 y.o. 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  Status post subtotal colectomy with residual 40 cm of colon and rectum remaining (general surgeon at [facility] was concerned about Crohn's disease).  He is intolerant to immunomodulators.  Has been on multiple courses of steroids, both oral and topical.  He had previously been on Humira  but this was discontinued due to drug related eczema.  He was last seen in the office November 29, 2021.  See that dictation.  He remains on Pentasa  2 g twice daily and Rowasa  enemas at night.  He uses Imodium regularly.  Sometimes has difficulty finding this at the pharmacy or store.  Last underwent surveillance colonoscopy January 2003.  He was found to have an inflammatory polyp which was removed.  As well patchy mildly active chronic colitis without dysplasia.  Office follow-up at this time recommended.  Repeat colonoscopy in 2 years from previous exam also recommended.   He is pleased to tell me that he is doing well.  He is at his baseline of 4-6 bowel movements per day.  No incontinence.  No bleeding or abdominal pain.  He does request medication refill.  He is concerned about his insurance companies benefit with regards to his medication.  Requesting some sort of letter.   REVIEW OF SYSTEMS:   All non-GI ROS negative unless otherwise stated in the HPI except for arthritis, sleeping problems       Past Medical History:  Diagnosis Date   Colon polyps      2007   Diabetes     Glaucoma     History of blood transfusion      2001   HTN (hypertension)     Hyperlipidemia     Inflammatory bowel disease     Primary generalized (osteo)arthritis     Ulcerative colitis, unspecified                 Past Surgical History:  Procedure Laterality Date   COLON RESECTION   2001   COLONOSCOPY   2018   cornea transplant both eyes Bilateral       left, 2004; right 2005   EYE SURGERY        laser OS April 2014   GLAUCOMA SURGERY Left 2014   VENTRAL HERNIA REPAIR   2002          Social History Matthew [name]  reports that he has never smoked. He has never used smokeless tobacco. He reports that he does not drink alcohol and does not use drugs.   family history includes Breast cancer in his cousin; Colitis in his father; Colon cancer in his maternal grandfather; Colon polyps in his father and mother; Heart disease in his father and paternal grandmother; Liver cancer in his maternal grandfather; Lung cancer in his father; Ovarian cancer in his mother; Prostate cancer in his cousin.   Allergies       Allergies  Allergen Reactions   Contrast Media [Iodinated Contrast Media] Hives   Dimetapp Allergy Sinus [Brompheniramine-Ppa-Apap] Hives   Doxycycline  Other (See Comments)      Per patient   Sulfamethoxazole Other (See Comments)      May have had conjunctivitis from sulfa eye drops            PHYSICAL EXAMINATION: Vital signs: BP 138/78   Pulse 68   Ht 5' 10 (1.778  m)   Wt 280 lb (127 kg)   BMI 40.18 kg/m   Constitutional: generally well-appearing, no acute distress Psychiatric: alert and oriented x3, cooperative Eyes: Anicteric Mouth: oral pharynx moist, no lesions Neck: supple no lymphadenopathy Cardiovascular: heart regular rate and rhythm, no murmur Lungs: clear to auscultation bilaterally Abdomen: soft, obese, nontender, nondistended, no obvious ascites, no peritoneal signs, normal bowel sounds, no organomegaly.  Previous surgical incisions well-healed Rectal: Omitted Extremities: no clubbing or cyanosis.  1+ lower extremity edema bilaterally Skin: no lesions on visible extremities Neuro: No focal deficits.  Cranial nerves intact   ASSESSMENT:   1.  History of indeterminate colitis status post subtotal colectomy.  No recent flares.  Currently asymptomatic on a combination of oral and topical mesalamine .   Intolerant to immunomodulators and anti-TNF agents as previously outlined 2.  Last colonoscopy January 2023.  Due for surveillance January 2025 3.  General medical problems.  Stable     PLAN:   1.  Refill Pentasa .  Medication risks reviewed 2.  Refill Rowasa  enemas.  Medication risks reviewed 3.  Continue Imodium as needed 4.  Routine office follow-up 1 year 5.  Ongoing general medical care with PCP      Previous office note as above.  No interval clinical change.  Now for his routine surveillance colonoscopy.

## 2024-06-12 NOTE — Patient Instructions (Signed)

## 2024-06-12 NOTE — Op Note (Signed)
 [facility] Patient Name: Matthew [name] Procedure Date: 06/12/2024 3:03 PM MRN: [id number] Endoscopist: Murel Arlington. Elvin Hammer , MD, [id number] Age: 58 Referring MD:  Date of Birth: 1966/03/27 Gender: Male Account #: [id number] Procedure:                Colonoscopy with cold snare polypectomy x 1; with                            biopsies Indications:              High risk colon cancer surveillance: Ulcerative                            pancolitis of 8 (or more) years duration.                            Indeterminate colitis for many years. Status post                            remote subtotal colectomy. Last surveillance                            examination 2023. Now for surveillance Medicines:                Monitored Anesthesia Care Procedure:                Pre-Anesthesia Assessment:                           - Prior to the procedure, a History and Physical                            was performed, and patient medications and                            allergies were reviewed. The patient's tolerance of                            previous anesthesia was also reviewed. The risks                            and benefits of the procedure and the sedation                            options and risks were discussed with the patient.                            All questions were answered, and informed consent                            was obtained. Prior Anticoagulants: The patient has                            taken no anticoagulant or antiplatelet agents. ASA  Grade Assessment: III - A patient with severe                            systemic disease. After reviewing the risks and                            benefits, the patient was deemed in satisfactory                            condition to undergo the procedure.                           After obtaining informed consent, the colonoscope                            was passed under direct vision.  Throughout the                            procedure, the patient's blood pressure, pulse, and                            oxygen saturations were monitored continuously. The                            CF [id number] #[id number] was introduced through the anus                            and advanced to the the ileocolonic anastomosis.                            The anastomosis and rectum were photographed. The                            quality of the bowel preparation was excellent. The                            colonoscopy was performed without difficulty. The                            patient tolerated the procedure well. The bowel                            preparation used was SUPREP via split dose                            instruction. Scope In: 3:28:33 PM Scope Out: 3:43:40 PM Scope Withdrawal Time: 0 hours 13 minutes 13 seconds  Total Procedure Duration: 0 hours 15 minutes 7 seconds  Findings:                 The ileocolonic anastomosis was at 40 cm from the                            anal verge. The anastomosis was patent. The ileum  was intubated and appeared normal. See images.                           There was a 2 cm friable polypoid lesion 2 cm below                            the anastomosis. This was biopsied multiple times.                           A 5 mm polyp was found in the descending colon. The                            polyp was removed with a cold snare. Resection and                            retrieval were complete.                           The colonic mucosa beyond this region revealed                            patchy erythema and scarring without obvious                            inflammation. Multiple biopsies taken.                           The exam was otherwise normal on direct and                            retroflexed views Complications:            No immediate complications. Estimated blood loss:                             None. Estimated Blood Loss:     Estimated blood loss: none. Impression:               1. Normal neoileum                           2. Ileocolonic anastomosis at 40 cm                           3. 2 cm friable polypoid mass lesion just below the                            anastomosis. Multiple biopsies. Rule out neoplasia                            versus inflammatory.                           4. Small adjacent polyp in the same region. Removed  with snare. Rule out adenoma.                           5. Patchy erythema and scarring throughout the                            remaining colon. Multiple biopsies taken.                           6. Otherwise normal on direct and retroflexed views. Recommendation:           - Repeat colonoscopy in 2 years for surveillance if                            no neoplasia or dysplasia on biopsies.                           - Patient has a contact number available for                            emergencies. The signs and symptoms of potential                            delayed complications were discussed with the                            patient. Return to normal activities tomorrow.                            Written discharge instructions were provided to the                            patient.                           - Resume previous diet.                           - Continue present medications.                           - Await pathology results. Murel Arlington. Elvin Hammer, MD 06/12/2024 3:55:46 PM This report has been signed electronically.

## 2024-06-12 NOTE — Progress Notes (Signed)
 Pt's states no medical or surgical changes since previsit or office visit.

## 2024-06-13 ENCOUNTER — Telehealth: Payer: Self-pay

## 2024-06-13 NOTE — Telephone Encounter (Signed)
 Unable to reach after follow up call.

## 2024-06-17 ENCOUNTER — Telehealth: Payer: Self-pay

## 2024-06-17 LAB — SURGICAL PATHOLOGY

## 2024-06-17 NOTE — Telephone Encounter (Signed)
 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 Dr Abran notified.

## 2024-06-17 NOTE — Telephone Encounter (Signed)
 Noted. I will address on my pathology results note. Thank you Dr.  Abran

## 2024-06-18 ENCOUNTER — Ambulatory Visit: Payer: Self-pay | Admitting: Internal Medicine

## 2024-06-18 ENCOUNTER — Other Ambulatory Visit: Payer: Self-pay

## 2024-06-18 DIAGNOSIS — C189 Malignant neoplasm of colon, unspecified: Secondary | ICD-10-CM

## 2024-06-18 DIAGNOSIS — K51919 Ulcerative colitis, unspecified with unspecified complications: Secondary | ICD-10-CM

## 2024-06-18 MED ORDER — PREDNISONE 50 MG PO TABS: ORAL_TABLET | ORAL | 0 refills | Status: AC

## 2024-06-18 MED ORDER — DIPHENHYDRAMINE HCL 25 MG PO TABS: ORAL_TABLET | ORAL | 0 refills | Status: AC

## 2024-06-20 ENCOUNTER — Other Ambulatory Visit (HOSPITAL_COMMUNITY)

## 2024-06-24 ENCOUNTER — Inpatient Hospital Stay

## 2024-06-24 ENCOUNTER — Ambulatory Visit: Attending: Oncology | Admitting: Oncology

## 2024-06-24 ENCOUNTER — Encounter: Payer: Self-pay | Admitting: Oncology

## 2024-06-24 VITALS — BP 165/90 | HR 78 | Temp 97.4°F | Resp 18 | Ht 70.0 in | Wt 284.1 lb

## 2024-06-24 DIAGNOSIS — C189 Malignant neoplasm of colon, unspecified: Secondary | ICD-10-CM

## 2024-06-24 DIAGNOSIS — Z800 Family history of malignant neoplasm of digestive organs: Secondary | ICD-10-CM | POA: Diagnosis not present

## 2024-06-24 DIAGNOSIS — Z8042 Family history of malignant neoplasm of prostate: Secondary | ICD-10-CM | POA: Diagnosis not present

## 2024-06-24 DIAGNOSIS — K5090 Crohn's disease, unspecified, without complications: Secondary | ICD-10-CM | POA: Diagnosis not present

## 2024-06-24 DIAGNOSIS — Z801 Family history of malignant neoplasm of trachea, bronchus and lung: Secondary | ICD-10-CM | POA: Diagnosis not present

## 2024-06-24 DIAGNOSIS — Z803 Family history of malignant neoplasm of breast: Secondary | ICD-10-CM | POA: Diagnosis not present

## 2024-06-24 DIAGNOSIS — Z8041 Family history of malignant neoplasm of ovary: Secondary | ICD-10-CM | POA: Insufficient documentation

## 2024-06-24 LAB — CMP (CANCER CENTER ONLY)
ALT: 23 U/L (ref 0–44)
AST: 28 U/L (ref 15–41)
Albumin: 3.8 g/dL (ref 3.5–5.0)
Alkaline Phosphatase: 66 U/L (ref 38–126)
Anion gap: 11 (ref 5–15)
BUN: 15 mg/dL (ref 6–20)
CO2: 23 mmol/L (ref 22–32)
Calcium: 9.3 mg/dL (ref 8.9–10.3)
Chloride: 108 mmol/L (ref 98–111)
Creatinine: 0.88 mg/dL (ref 0.61–1.24)
GFR, Estimated: >60 mL/min
Glucose, Bld: 130 mg/dL — ABNORMAL HIGH (ref 70–99)
Potassium: 3.8 mmol/L (ref 3.5–5.1)
Sodium: 142 mmol/L (ref 135–145)
Total Bilirubin: 1.3 mg/dL — ABNORMAL HIGH (ref 0.0–1.2)
Total Protein: 7.6 g/dL (ref 6.5–8.1)

## 2024-06-24 LAB — CBC WITH DIFFERENTIAL/PLATELET
Abs Immature Granulocytes: 0.04 K/uL (ref 0.00–0.07)
Basophils Absolute: 0.1 K/uL (ref 0.0–0.1)
Basophils Relative: 1 %
Eosinophils Absolute: 0.5 K/uL (ref 0.0–0.5)
Eosinophils Relative: 7 %
HCT: 41.9 % (ref 39.0–52.0)
Hemoglobin: 14 g/dL (ref 13.0–17.0)
Immature Granulocytes: 1 %
Lymphocytes Relative: 28 %
Lymphs Abs: 2.2 K/uL (ref 0.7–4.0)
MCH: 28.6 pg (ref 26.0–34.0)
MCHC: 33.4 g/dL (ref 30.0–36.0)
MCV: 85.5 fL (ref 80.0–100.0)
Monocytes Absolute: 0.9 K/uL (ref 0.1–1.0)
Monocytes Relative: 12 %
Neutro Abs: 4.1 K/uL (ref 1.7–7.7)
Neutrophils Relative %: 51 %
Platelets: 230 K/uL (ref 150–400)
RBC: 4.9 MIL/uL (ref 4.22–5.81)
RDW: 13.3 % (ref 11.5–15.5)
WBC: 7.9 K/uL (ref 4.0–10.5)
nRBC: 0 % (ref 0.0–0.2)

## 2024-06-24 NOTE — Progress Notes (Signed)
 [facility]  Telephone:([phone]) [phone] [phone]) [phone]  ID: Matthew [name] OB: 03-01-1966  MR#: [id number]  RDW#:[id number]  Patient Care Team: Arloa Lauraine Hun, FNP as PCP - General (Nurse Practitioner) Maurie Rayfield BIRCH, RN as Oncology Nurse Navigator Jacobo, Evalene PARAS, MD as Consulting Physician (Oncology)  CHIEF COMPLAINT: Colon cancer  INTERVAL HISTORY: Patient is a 58 year old male with a history of subtotal colectomy in 2001 who recently underwent screening colonoscopy on June 12, 2024.  Patient was noted to have a 2 cm fry double mass lesion near the anastomosis.  Subsequent pathology confirmed malignancy.  He currently feels well and is asymptomatic.  He has no neurological plaints.  He denies any recent fevers or illnesses.  He has a good appetite and denies weight loss.  He has no chest pain, shortness of breath, cough, or hemoptysis.  He denies any nausea, vomiting, constipation, diarrhea.  He has no melena or hematochezia.  He has no urinary complaints.  Patient offers no specific complaints today.  REVIEW OF SYSTEMS:   Review of Systems  Constitutional: Negative.  Negative for fever, malaise/fatigue and weight loss.  Respiratory: Negative.  Negative for cough, hemoptysis and shortness of breath.   Cardiovascular: Negative.  Negative for chest pain and leg swelling.  Gastrointestinal: Negative.  Negative for abdominal pain.  Genitourinary: Negative.  Negative for dysuria.  Musculoskeletal: Negative.  Negative for back pain.  Skin: Negative.  Negative for rash.  Neurological: Negative.  Negative for dizziness, focal weakness, weakness and headaches.  Psychiatric/Behavioral: Negative.  The patient is not nervous/anxious.     As per HPI. Otherwise, a complete review of systems is negative.  PAST MEDICAL HISTORY: Past Medical History:  Diagnosis Date   Colon polyps    2007   Crohn's disease (HCC)    Diabetes (HCC)    Glaucoma    History of  blood transfusion    2001   HTN (hypertension)    Hyperlipidemia    Inflammatory bowel disease    Primary generalized (osteo)arthritis    Ulcerative colitis, unspecified     PAST SURGICAL HISTORY: Past Surgical History:  Procedure Laterality Date   COLON RESECTION  2001   COLONOSCOPY  2018   cornea transplant both eyes Bilateral    left, 2004; right 2005   EYE SURGERY     laser OS April 2014   GLAUCOMA SURGERY Left 2014   VENTRAL HERNIA REPAIR  2002    FAMILY HISTORY: Family History  Problem Relation Age of Onset   Colon cancer Maternal Grandfather    Liver cancer Maternal Grandfather    Colitis Father    Colon polyps Father    Heart disease Father    Lung cancer Father    Colon polyps Mother    Ovarian cancer Mother    Heart disease Paternal Grandmother    Breast cancer Cousin    Prostate cancer Cousin    Esophageal cancer Neg Hx    Rectal cancer Neg Hx    Stomach cancer Neg Hx     ADVANCED DIRECTIVES (Y/N):  N  HEALTH MAINTENANCE: Social History   Tobacco Use   Smoking status: Never   Smokeless tobacco: Never  Vaping Use   Vaping status: Never Used  Substance Use Topics   Alcohol use: No   Drug use: No     Colonoscopy:  PAP:  Bone density:  Lipid panel:  Allergies  Allergen Reactions   Contrast Media [Iodinated Contrast Media] Hives   Dimetapp Allergy  Sinus [Brompheniramine-Ppa-Apap] Hives   Doxycycline  Nausea And Vomiting   Pseudoephedrine Hives and Other (See Comments)    redness   Prednisone  Nausea And Vomiting    Current Outpatient Medications  Medication Sig Dispense Refill   acetaminophen  (TYLENOL ) 500 MG tablet Take 500 mg by mouth every 6 (six) hours as needed for pain.     amLODipine (NORVASC) 10 MG tablet Take 10 mg by mouth daily.     atorvastatin (LIPITOR) 10 MG tablet Take 10 mg by mouth daily.  2   clotrimazole (LOTRIMIN) 1 % cream APPLY TO THE AFFECTED AREAS 2 TIMES PER DAY FOR 2-4 WEEKS     diphenhydrAMINE  (BENADRYL   ALLERGY) 25 MG tablet Take both tabs by mouth 7/9 at 1pm 2 tablet 0   diphenhydrAMINE  (BENADRYL ) 25 MG tablet Take 2 tabs at 9:30am 09/05/12 2 tablet 0   fluorometholone (FML) 0.1 % ophthalmic suspension Place 1 drop into the right eye 2 (two) times daily.     glucosamine-chondroitin 500-400 MG tablet 1 tablet.     glucose blood test strip TAKE 1 STRIP EVERY DAY BY MISCELL. ROUTE AS NEEDED.     Lancets (ONETOUCH ULTRASOFT) lancets USE 1 LANCET EVERYDAY AS NEEDED     Loperamide HCl (IMODIUM PO) Take by mouth daily.     losartan-hydrochlorothiazide (HYZAAR) 100-25 MG tablet Take 1 tablet by mouth daily.  2   mesalamine  (PENTASA ) 500 MG CR capsule TAKE 4 CAPSULES BY MOUTH TWICE A DAY 720 capsule 3   mesalamine  (ROWASA ) 4 g enema PLACE 60 MLS (4 G TOTAL) RECTALLY AT BEDTIME. 5040 mL 4   metFORMIN (GLUCOPHAGE) 500 MG tablet Take 1 tablet by mouth 1 day or 1 dose.  2   Multiple Vitamin (MULTI-VITAMINS) TABS 1 tablet.     NON FORMULARY Place 1 drop into both eyes.     oxybutynin (DITROPAN) 5 MG tablet Take 5 mg by mouth.     OZEMPIC, 0.25 OR 0.5 MG/DOSE, 2 MG/1.5ML SOPN Inject 0.5 mg as directed once a week.     polyvinyl alcohol (LIQUIFILM TEARS) 1.4 % ophthalmic solution 1 drop at bedtime and may repeat dose one time if needed.     propranolol (INDERAL) 60 MG tablet Take 60 mg by mouth 2 (two) times daily.     timolol (TIMOPTIC) 0.5 % ophthalmic solution Place 1 drop into the left eye 2 (two) times daily.     tobramycin-dexamethasone (TOBRADEX) ophthalmic solution Place 1 drop into the left eye 3 times daily. for 1 week, taper to 2 times a day for 1 week, taper 1 time a day for 1 week then stop.     zolpidem  (AMBIEN ) 10 MG tablet Take 1 tablet (10 mg total) by mouth at bedtime. Office visit for further refills 30 tablet 0   OVER THE COUNTER MEDICATION daily as needed. GAS X (Patient not taking: Reported on 06/24/2024)     predniSONE  (DELTASONE ) 50 MG tablet Pt to take 1 tab by mouth on 7/9 at 1am, then  1 tab at 7am, then 1 tab at 1pm prior to ct scan (Patient not taking: Reported on 06/24/2024) 3 tablet 0   No current facility-administered medications for this visit.    OBJECTIVE: Vitals:   06/24/24 1445  BP: (!) 165/90  Pulse: 78  Resp: 18  Temp: (!) 97.4 F (36.3 C)  SpO2: 98%     Body mass index is 40.76 kg/m.    ECOG FS:0 - Asymptomatic  General: Well-developed, well-nourished, no acute  distress. Eyes: Pink conjunctiva, anicteric sclera. HEENT: Normocephalic, moist mucous membranes. Lungs: No audible wheezing or coughing. Heart: Regular rate and rhythm. Abdomen: Soft, nontender, no obvious distention. Musculoskeletal: No edema, cyanosis, or clubbing. Neuro: Alert, answering all questions appropriately. Cranial nerves grossly intact. Skin: No rashes or petechiae noted. Psych: Normal affect. Lymphatics: No cervical, calvicular, axillary or inguinal LAD.   LAB RESULTS:  Lab Results  Component Value Date   NA 136 09/06/2012   K 3.1 (L) 09/06/2012   CL 107 09/06/2012   CO2 21 09/06/2012   GLUCOSE 123 (H) 09/06/2012   BUN 17 09/06/2012   CREATININE 1.0 09/06/2012   CALCIUM 8.4 09/06/2012   PROT 7.4 09/06/2012   ALBUMIN 3.3 (L) 09/06/2012   AST 40 (H) 09/06/2012   ALT 31 09/06/2012   ALKPHOS 60 09/06/2012   BILITOT 1.0 09/06/2012   GFRNONAA >90 09/03/2012   GFRAA >90 09/03/2012    Lab Results  Component Value Date   WBC 7.9 06/24/2024   NEUTROABS 4.1 06/24/2024   HGB 14.0 06/24/2024   HCT 41.9 06/24/2024   MCV 85.5 06/24/2024   PLT 230 06/24/2024     STUDIES: No results found.  ASSESSMENT: Colon cancer.  PLAN:    Colon cancer: Screening colonoscopy on June 12, 2024 for patient's underlying Crohn's disease incidentally noted a 2 cm friable mass near the anastomosis of his previous surgery.  Biopsy confirmed malignancy.  Patient has a staging CT scan scheduled for July 02, 2024.  CEA is pending at time of dictation.  Given patient's altered anatomy from  his subtotal colectomy for Crohn's disease in 2001, a referral has been made to [facility] to see a [facility] onc[facility] Careers adviser.  Patient will return to clinic approximately 2 weeks postoperatively to discuss his final pathology results and discussion on whether additional treatment is necessary.  I spent a total of 60 minutes reviewing chart data, face-to-face evaluation with the patient, counseling and coordination of care as detailed above.  Patient expressed understanding and was in agreement with this plan. He also understands that He can call clinic at any time with any questions, concerns, or complaints.    Cancer Staging  No matching staging information was found for the patient.   Evalene JINNY Reusing, MD   06/24/2024 4:17 PM

## 2024-06-24 NOTE — Progress Notes (Signed)
 Dr. Jacobo,   Thanks for seeing Jerona. I have referred him to Dr. Cy Brochure (IBD specialist IBD surgeon) at [facility].  Norleen SAILOR. Abran Raddle., M.D. [facility] Division of Gastroenterology   Rock, Follow up with Dr. Brochure. I'd like Kyri to be seen soon. I'm happy to speak with her, if there are any questions. Thanks, JP

## 2024-06-25 ENCOUNTER — Telehealth: Payer: Self-pay

## 2024-06-25 LAB — CEA: CEA: 1.4 ng/mL (ref 0.0–4.7)

## 2024-06-25 NOTE — Telephone Encounter (Signed)
 Message sent to cancel [facility] referral as Dr. Abran notified us  that he sent a referral to [facility].

## 2024-06-25 NOTE — Telephone Encounter (Signed)
 Referral sent to [facility] surgical oncology at [facility].

## 2024-07-02 ENCOUNTER — Ambulatory Visit (HOSPITAL_COMMUNITY)
Admission: RE | Admit: 2024-07-02 | Discharge: 2024-07-02 | Disposition: A | Discharge status: Home / Self Care | Source: Ambulatory Visit | Attending: Internal Medicine | Admitting: Internal Medicine

## 2024-07-02 DIAGNOSIS — K51919 Ulcerative colitis, unspecified with unspecified complications: Secondary | ICD-10-CM | POA: Insufficient documentation

## 2024-07-02 DIAGNOSIS — C189 Malignant neoplasm of colon, unspecified: Secondary | ICD-10-CM | POA: Insufficient documentation

## 2024-07-02 MED ORDER — IOHEXOL 9 MG/ML PO SOLN: 1000.0000 mL | ORAL | Status: AC

## 2024-07-02 MED ORDER — IOHEXOL 9 MG/ML PO SOLN
ORAL | Status: AC
Start: 2024-07-02 — End: 2024-07-02
  Filled 2024-07-02: qty 1000

## 2024-07-02 MED ORDER — IOHEXOL 300 MG/ML  SOLN
100.0000 mL | Freq: Once | INTRAMUSCULAR | Status: AC | PRN
  Administered 2024-07-02: 100 mL via INTRAVENOUS

## 2024-07-02 MED ORDER — SODIUM CHLORIDE (PF) 0.9 % IJ SOLN
INTRAMUSCULAR | Status: AC
  Filled 2024-07-02: qty 50

## 2024-07-03 ENCOUNTER — Ambulatory Visit: Payer: Self-pay | Admitting: Internal Medicine

## 2024-07-09 ENCOUNTER — Inpatient Hospital Stay (HOSPITAL_BASED_OUTPATIENT_CLINIC_OR_DEPARTMENT_OTHER): Admitting: Hospice and Palliative Medicine

## 2024-07-09 DIAGNOSIS — C189 Malignant neoplasm of colon, unspecified: Secondary | ICD-10-CM

## 2024-07-09 NOTE — Progress Notes (Signed)
 Multidisciplinary Oncology Council Documentation  Matthew [name] was presented by our [facility] on 07/09/2024, which included representatives from:  Palliative Care Dietitian  Physical/Occupational Therapist Nurse Navigator Genetics Social work Survivorship RN Financial Navigator Research RN   Matthew [name] currently presents with history of colon cancer  We reviewed previous medical and familial history, history of present illness, and recent lab results along with all available histopathologic and imaging studies. The MOC considered available treatment options and made the following recommendations/referrals:  SW  The MOC is a meeting of clinicians from various specialty areas who evaluate and discuss patients for whom a multidisciplinary approach is being considered. Final determinations in the plan of care are those of the provider(s).   Today's extended care, comprehensive team conference, Matthew [name] was not present for the discussion and was not examined.

## 2024-07-11 ENCOUNTER — Inpatient Hospital Stay

## 2024-07-11 NOTE — Progress Notes (Signed)
 [facility] Clinical Social Work  Initial Assessment   Matthew [name] is a 58 y.o. year old male contacted by phone. Clinical Social Work was referred by [facility] for assessment of psychosocial needs.   SDOH (Social Determinants of Health) assessments performed: Yes SDOH Interventions    Flowsheet Row Office Visit from 06/24/2024 in [facility] Of [place]  SDOH Interventions   Food Insecurity Interventions Intervention Not Indicated  Housing Interventions Intervention Not Indicated  Transportation Interventions Intervention Not Indicated  Utilities Interventions Intervention Not Indicated    SDOH Screenings   Food Insecurity: No Food Insecurity (06/25/2024)  Housing: Unknown (06/25/2024)  Transportation Needs: No Transportation Needs (06/25/2024)  Utilities: Not At Risk (06/25/2024)  Depression (PHQ2-9): Low Risk  (06/24/2024)  Tobacco Use: Low Risk  (07/08/2024)   Received from [facility]     Distress Screen completed: No     No data to display            Family/Social Information:  Housing Arrangement: patient lives alone Family members/support persons in your life? Family and Church.  Patient reports that his sister, Matthew [name], is supportive. Transportation concerns: no  Employment: Legally disabled.  Patient stated for mutiple reasons.  Income source: Secretary/administrator concerns: No Type of concern: None Food access concerns: no Religious or spiritual practice: Yes-[employer] Advanced directives: No Services Currently in place:  [employer]  Coping/ Adjustment to diagnosis: Patient understands treatment plan and what happens next? yes Concerns about diagnosis and/or treatment: Patient did not disclose.  He said he has to make a decision regarding his surgery. Patient reported stressors: Adjusting to my illness Hopes and/or priorities: Family Patient enjoys watching TV.  Patient said he enjoys watching old  movies. Current coping skills/ strengths: Capable of independent living , Communication skills , Contractor , General fund of knowledge , Religious Affiliation , and Supportive family/friends     SUMMARY: Current SDOH Barriers:  None per patient.  Clinical Social Work Clinical Goal(s):  Patient will work with SW to address concerns related to adjusting to his illness as patient determines.  Interventions: Discussed common feeling and emotions when being diagnosed with cancer, and the importance of support during treatment Informed patient of the support team roles and support services at [facility] Provided CSW contact information and encouraged patient to call with any questions or concerns Provided patient with information about the [employer].  Mailed patient the [employer] and CSW contact information.    Follow Up Plan: CSW will follow-up with patient by phone  Patient verbalizes understanding of plan: Yes    Matthew CHRISTELLA Au, LCSW Clinical Social Worker [facility]

## 2024-07-14 DIAGNOSIS — C186 Malignant neoplasm of descending colon: Secondary | ICD-10-CM | POA: Insufficient documentation

## 2024-07-24 ENCOUNTER — Other Ambulatory Visit: Payer: Self-pay | Admitting: Licensed Clinical Social Worker

## 2024-07-24 ENCOUNTER — Inpatient Hospital Stay (HOSPITAL_BASED_OUTPATIENT_CLINIC_OR_DEPARTMENT_OTHER): Admitting: Licensed Clinical Social Worker

## 2024-07-24 ENCOUNTER — Encounter: Payer: Self-pay | Admitting: Licensed Clinical Social Worker

## 2024-07-24 ENCOUNTER — Inpatient Hospital Stay

## 2024-07-24 DIAGNOSIS — Z8041 Family history of malignant neoplasm of ovary: Secondary | ICD-10-CM

## 2024-07-24 DIAGNOSIS — Z800 Family history of malignant neoplasm of digestive organs: Secondary | ICD-10-CM

## 2024-07-24 DIAGNOSIS — Z801 Family history of malignant neoplasm of trachea, bronchus and lung: Secondary | ICD-10-CM

## 2024-07-24 DIAGNOSIS — C189 Malignant neoplasm of colon, unspecified: Secondary | ICD-10-CM | POA: Diagnosis not present

## 2024-07-24 DIAGNOSIS — Z8042 Family history of malignant neoplasm of prostate: Secondary | ICD-10-CM

## 2024-07-24 DIAGNOSIS — Z803 Family history of malignant neoplasm of breast: Secondary | ICD-10-CM

## 2024-07-24 LAB — GENETIC SCREENING ORDER

## 2024-07-24 NOTE — Progress Notes (Signed)
 REFERRING PROVIDER: Jacobo Evalene PARAS, MD [address] [place],  [place] [address]  PRIMARY PROVIDER:  Arloa Lauraine Hun, FNP  PRIMARY REASON FOR VISIT:  1. Malignant neoplasm of colon, unspecified part of colon (HCC)   2. Family history of colon cancer   3. Family history of ovarian cancer   4. Family history of breast cancer   5. Family history of prostate cancer   6. Family history of lung cancer      HISTORY OF PRESENT ILLNESS:   Matthew [name], a 58 y.o. male, was seen for a [place] cancer genetics consultation at the request of Dr. Jacobo due to a personal and family history of cancer.  Matthew [name] presents to clinic today to discuss the possibility of a hereditary predisposition to cancer, genetic testing, and to further clarify his future cancer risks, as well as potential cancer risks for family members.   CANCER HISTORY:  In 2025, at the age of 36, Matthew [name] was diagnosed with colon cancer. He will see a Careers adviser at [facility] next week, he is currently planning to have colon removed.   RELEVANT MEDICAL HISTORY:  Crohn's disease, history of colon resection in 2001. Patient reports that they are unsure if this was for Crohn's or if he had cancer at that time as well.   Past Medical History:  Diagnosis Date   Colon polyps    2007   Crohn's disease (HCC)    Diabetes (HCC)    Glaucoma    History of blood transfusion    2001   HTN (hypertension)    Hyperlipidemia    Inflammatory bowel disease    Primary generalized (osteo)arthritis    Ulcerative colitis, unspecified     Past Surgical History:  Procedure Laterality Date   COLON RESECTION  2001   COLONOSCOPY  2018   cornea transplant both eyes Bilateral    left, 2004; right 2005   EYE SURGERY     laser OS April 2014   GLAUCOMA SURGERY Left 2014   VENTRAL HERNIA REPAIR  2002    FAMILY HISTORY:  We obtained a detailed, 4-generation family history.  Significant diagnoses are listed below: Family  History  Problem Relation Age of Onset   Colon polyps Mother    Ovarian cancer Mother 50       reported neg GT 2016   Colitis Father    Colon polyps Father    Heart disease Father    Lung cancer Father 80   Lung cancer Maternal Aunt    Lung cancer Maternal Aunt    Lung cancer Maternal Aunt    Cancer Maternal Uncle        unk type   Colon cancer Maternal Grandfather 8   Heart disease Paternal Grandmother    Brain cancer Paternal Grandfather    Breast cancer Maternal Cousin    Prostate cancer Maternal Cousin    Bone cancer Maternal Cousin        spine   Colon cancer Maternal Cousin    Cervical cancer Maternal Cousin    Esophageal cancer Neg Hx    Rectal cancer Neg Hx    Stomach cancer Neg Hx    Matthew [name] has 1 sister, Matthew [name], 59, no history of cancer.  Matthew [name] mother had ovarian cancer at 24 and passed of it at 35. She reportedly had negative genetic testing at the time (2016). Patient had 4 maternal aunts and 1 uncle. Three aunts died of lung cancer, his uncle  died of cancer, unknown type. Several cousins have had cancer. One had breast and skin cancer, another had colon cancer, another had spine cancer and passed of this, another cousin had prostate cancer and another cousin currently has cervical cancer. Patient's maternal grandfather had colon cancer and passed at 76.   Matthew [name] father passed of metastatic lung cancer at 68. Limited information about paternal family history. Paternal grandfather had brain cancer and died at 23. Grandmother had skin cancer. She passed at 31 of dementia.  Matthew [name] is aware of previous family history of genetic testing for hereditary cancer risks. There is no reported Ashkenazi Jewish ancestry. There is no known consanguinity.    GENETIC COUNSELING ASSESSMENT: Matthew [name] is a 58 y.o. male with a personal and family history of colon cancer which is somewhat suggestive of a hereditary cancer syndrome and predisposition to  cancer. We, therefore, discussed and recommended the following at today's visit.   DISCUSSION: We discussed that, in general, most cancer is not inherited in families, but instead is sporadic or familial. Sporadic cancers occur by chance and typically happen at older ages (>50 years) as this type of cancer is caused by genetic changes acquired during an individual's lifetime. Some families have more cancers than would be expected by chance; however, the ages or types of cancer are not consistent with a known genetic mutation or known genetic mutations have been ruled out. This type of familial cancer is thought to be due to a combination of multiple genetic, environmental, hormonal, and lifestyle factors. While this combination of factors likely increases the risk of cancer, the exact source of this risk is not currently identifiable or testable.    We discussed that approximately 10% of colorectal cancer is hereditary. Most cases of hereditary colorectal cancer are associated with Lynch syndrome genes, although there are other genes associated with hereditary cancer as well. Cancers and risks are gene specific. We discussed that testing is beneficial for several reasons including knowing about cancer risks, identifying potential screening and risk-reduction options that may be appropriate, and to understand if other family members could be at risk for cancer and allow them to undergo genetic testing.   We reviewed the characteristics, features and inheritance patterns of hereditary cancer syndromes. We also discussed genetic testing, including the appropriate family members to test, the process of testing, insurance coverage and turn-around-time for results. We discussed the implications of a negative, positive and/or variant of uncertain significant result. We recommended Matthew [name] pursue genetic testing for the [facility] CancerNext-Expanded+RNA gene panel.   The CancerNext-Expanded gene panel offered by  [facility] and includes sequencing, rearrangement, and RNA analysis for the following 77 genes: AIP, ALK, APC, ATM, AXIN2, BAP1, BARD1, BMPR1A, BRCA1, BRCA2, BRIP1, CDC73, CDH1, CDK4, CDKN1B, CDKN2A, CEBPA, CHEK2, CTNNA1, DDX41, DICER1, ETV6, FH, FLCN, GATA2, LZTR1, MAX, MBD4, MEN1, MET, MLH1, MSH2, MSH3, MSH6, MUTYH, NF1, NF2, NTHL1, PALB2, PHOX2B, PMS2, POT1, PRKAR1A, PTCH1, PTEN, RAD51C, RAD51D, RB1, RET, RPS20, RUNX1, SDHA, SDHAF2, SDHB, SDHC, SDHD, SMAD4, SMARCA4, SMARCB1, SMARCE1, STK11, SUFU, TMEM127, TP53, TSC1, TSC2, VHL, and WT1 (sequencing and deletion/duplication); EGFR, HOXB13, KIT, MITF, PDGFRA, POLD1, and POLE (sequencing only); EPCAM and GREM1 (deletion/duplication only).   Based on Matthew [name]'s personal and family history of cancer, he meets medical criteria for genetic testing. Despite that he meets criteria, he may still have an out of pocket cost.   PLAN: After considering the risks, benefits, and limitations, Matthew [name] provided informed consent to pursue genetic testing and  the blood sample was sent to [facility] for analysis of the CancerNext-Expanded+RNA panel. Results should be available within approximately 2-3 weeks' time, at which point they will be disclosed by telephone to Matthew [name], as will any additional recommendations warranted by these results. Matthew [name] will receive a summary of his genetic counseling visit and a copy of his results once available. This information will also be available in Epic.   Matthew [name] questions were answered to his satisfaction today. Our contact information was provided should additional questions or concerns arise. Thank you for the referral and allowing us  to share in the care of your patient.   Dena Cary, MS, [facility] Genetic Counselor [email or web address] Phone: [phone]  50 minutes were spent on the date of the encounter in service to the patient including preparation, face-to-face consultation,  documentation and care coordination. Patient's sister was present as well. Dr. Delinda was available for discussion regarding this case.   _______________________________________________________________________ For Office Staff:  Number of people involved in session: 2 Was an Intern/ student involved with case: no

## 2024-08-06 DIAGNOSIS — Z8719 Personal history of other diseases of the digestive system: Secondary | ICD-10-CM | POA: Insufficient documentation

## 2024-08-12 ENCOUNTER — Ambulatory Visit: Payer: Self-pay | Admitting: Licensed Clinical Social Worker

## 2024-08-12 ENCOUNTER — Encounter: Payer: Self-pay | Admitting: Licensed Clinical Social Worker

## 2024-08-12 ENCOUNTER — Telehealth: Payer: Self-pay | Admitting: Licensed Clinical Social Worker

## 2024-08-12 DIAGNOSIS — Z1379 Encounter for other screening for genetic and chromosomal anomalies: Secondary | ICD-10-CM | POA: Insufficient documentation

## 2024-08-12 NOTE — Telephone Encounter (Signed)
 I contacted Mr. Scallon to discuss his genetic testing results. No pathogenic variants were identified in the 77 genes analyzed. Detailed clinic note to follow.   The test report has been scanned into EPIC and is located under the Molecular Pathology section of the Results Review tab.  A portion of the result report is included below for reference.      Dena Cary, MS, [facility] Genetic Counselor [email or web address] Phone: [phone]

## 2024-08-12 NOTE — Progress Notes (Signed)
 HPI:   Matthew [name] was previously seen in the [facility] due to a personal and family history of cancer and concerns regarding a hereditary predisposition to cancer. Please refer to our prior cancer genetics clinic note for more information regarding our discussion, assessment and recommendations, at the time. Mr. Dollard recent genetic test results were disclosed to him, as were recommendations warranted by these results. These results and recommendations are discussed in more detail below.  CANCER HISTORY:  Oncology History   No history exists.    FAMILY HISTORY:  We obtained a detailed, 4-generation family history.  Significant diagnoses are listed below: Family History  Problem Relation Age of Onset   Colon polyps Mother    Ovarian cancer Mother 39       reported neg GT 2016   Colitis Father    Colon polyps Father    Heart disease Father    Lung cancer Father 22   Lung cancer Maternal Aunt    Lung cancer Maternal Aunt    Lung cancer Maternal Aunt    Cancer Maternal Uncle        unk type   Colon cancer Maternal Grandfather 72   Heart disease Paternal Grandmother    Brain cancer Paternal Grandfather    Breast cancer Maternal Cousin    Prostate cancer Maternal Cousin    Bone cancer Maternal Cousin        spine   Colon cancer Maternal Cousin    Cervical cancer Maternal Cousin    Esophageal cancer Neg Hx    Rectal cancer Neg Hx    Stomach cancer Neg Hx     Mr. Rami has 1 sister, [name], 40, no history of cancer.   Mr. Spychalski mother had ovarian cancer at 35 and passed of it at 80. She reportedly had negative genetic testing at the time (2016). Patient had 4 maternal aunts and 1 uncle. Three aunts died of lung cancer, his uncle died of cancer, unknown type. Several cousins have had cancer. One had breast and skin cancer, another had colon cancer, another had spine cancer and passed of this, another cousin had prostate cancer and another cousin  currently has cervical cancer. Patient's maternal grandfather had colon cancer and passed at 86.    Mr. Campoli father passed of metastatic lung cancer at 51. Limited information about paternal family history. Paternal grandfather had brain cancer and died at 4. Grandmother had skin cancer. She passed at 48 of dementia.   Mr. Vallin is aware of previous family history of genetic testing for hereditary cancer risks. There is no reported Ashkenazi Jewish ancestry. There is no known consanguinity.     GENETIC TEST RESULTS:  The [facility] CancerNext-Expanded+RNA Panel found no pathogenic mutations.   The CancerNext-Expanded gene panel offered by [facility] and includes sequencing, rearrangement, and RNA analysis for the following 77 genes: AIP, ALK, APC, ATM, AXIN2, BAP1, BARD1, BMPR1A, BRCA1, BRCA2, BRIP1, CDC73, CDH1, CDK4, CDKN1B, CDKN2A, CEBPA, CHEK2, CTNNA1, DDX41, DICER1, ETV6, FH, FLCN, GATA2, LZTR1, MAX, MBD4, MEN1, MET, MLH1, MSH2, MSH3, MSH6, MUTYH, NF1, NF2, NTHL1, PALB2, PHOX2B, PMS2, POT1, PRKAR1A, PTCH1, PTEN, RAD51C, RAD51D, RB1, RET, RPS20, RUNX1, SDHA, SDHAF2, SDHB, SDHC, SDHD, SMAD4, SMARCA4, SMARCB1, SMARCE1, STK11, SUFU, TMEM127, TP53, TSC1, TSC2, VHL, and WT1 (sequencing and deletion/duplication); EGFR, HOXB13, KIT, MITF, PDGFRA, POLD1, and POLE (sequencing only); EPCAM and GREM1 (deletion/duplication only)..   The test report has been scanned into EPIC and is located under the Molecular Pathology section of  the Results Review tab.  A portion of the result report is included below for reference. Genetic testing reported out on 08/11/2024.      Even though a pathogenic variant was not identified, possible explanations for the cancer in the family may include: There may be no hereditary risk for cancer in the family. The cancers in Mr. Borchers and/or his family may be sporadic/familial or due to other genetic and environmental factors. There may be a gene mutation in one of  these genes that current testing methods cannot detect but that chance is small. There could be another gene that has not yet been discovered, or that we have not yet tested, that is responsible for the cancer diagnoses in the family.  It is also possible there is a hereditary cause for the cancer in the family that Mr. Dohse did not inherit.  Therefore, it is important to remain in touch with cancer genetics in the future so that we can continue to offer Mr. Wallman the most up to date genetic testing.   ADDITIONAL GENETIC TESTING:  We discussed with Mr. Legate that his genetic testing was fairly extensive.  If there are additional relevant genes identified to increase cancer risk that can be analyzed in the future, we would be happy to discuss and coordinate this testing at that time.    CANCER SCREENING RECOMMENDATIONS:  Mr. Cappiello test result is considered negative (normal).  This means that we have not identified a hereditary cause for his personal and family history of cancer at this time.   An individual's cancer risk and medical management are not determined by genetic test results alone. Overall cancer risk assessment incorporates additional factors, including personal medical history, family history, and any available genetic information that may result in a personalized plan for cancer prevention and surveillance. Therefore, it is recommended he continue to follow the cancer management and screening guidelines provided by his oncology and primary healthcare provider.  RECOMMENDATIONS FOR FAMILY MEMBERS:   Since he did not inherit a identifiable mutation in a cancer predisposition gene included on this panel, his children could not have inherited a known mutation from him in one of these genes. Individuals in this family might be at some increased risk of developing cancer, over the general population risk, due to the family history of cancer.  Individuals in the family should  notify their providers of the family history of cancer. We recommend women in this family have a yearly mammogram beginning at age 66, or 40 years younger than the earliest onset of cancer, an annual clinical breast exam, and perform monthly breast self-exams.  Family members should have colonoscopies by at age 68, or earlier, as recommended by their providers. Other members of the family may still carry a pathogenic variant in one of these genes that Mr. Hsiao did not inherit. Based on the family history, we recommend his sister/maternal relatives consider genetic counseling and testing. Mr. Boswell will let us  know if we can be of any assistance in coordinating genetic counseling and/or testing for this family member.     FOLLOW-UP:  Lastly, we discussed with Mr. Danielson that cancer genetics is a rapidly advancing field and it is possible that new genetic tests will be appropriate for him and/or his family members in the future. We encouraged him to remain in contact with cancer genetics on an annual basis so we can update his personal and family histories and let him know of advances in cancer genetics that  may benefit this family.   Our contact number was provided. Mr. Hadden questions were answered to his satisfaction, and he knows he is welcome to call us  at anytime with additional questions or concerns.    Dena Cary, MS, [facility] Genetic Counselor [email or web address] Phone: [phone]

## 2024-09-12 DIAGNOSIS — Z932 Ileostomy status: Secondary | ICD-10-CM | POA: Insufficient documentation

## 2024-09-16 ENCOUNTER — Telehealth: Payer: Self-pay

## 2024-09-16 NOTE — Telephone Encounter (Signed)
 na

## 2024-09-18 ENCOUNTER — Other Ambulatory Visit: Payer: Self-pay | Admitting: Oncology

## 2024-09-18 ENCOUNTER — Telehealth: Payer: Self-pay | Admitting: *Deleted

## 2024-09-18 DIAGNOSIS — D509 Iron deficiency anemia, unspecified: Secondary | ICD-10-CM | POA: Insufficient documentation

## 2024-09-18 NOTE — Telephone Encounter (Signed)
 Sister said that they have a oh hour drive to get there and she already had the labs that we needed on 09/12/2024 and another place.  They are canceling the lab appointment for but he still will be coming on 10/1. Sister knows

## 2024-09-22 ENCOUNTER — Other Ambulatory Visit: Payer: Self-pay | Admitting: *Deleted

## 2024-09-22 DIAGNOSIS — D509 Iron deficiency anemia, unspecified: Secondary | ICD-10-CM

## 2024-09-23 ENCOUNTER — Other Ambulatory Visit: Attending: Oncology

## 2024-09-24 ENCOUNTER — Inpatient Hospital Stay

## 2024-09-24 ENCOUNTER — Inpatient Hospital Stay: Attending: Oncology | Admitting: Oncology

## 2024-09-24 VITALS — BP 133/81 | HR 63 | Temp 97.9°F | Resp 18 | Ht 70.0 in | Wt 265.0 lb

## 2024-09-24 VITALS — BP 137/67 | HR 64

## 2024-09-24 DIAGNOSIS — D509 Iron deficiency anemia, unspecified: Secondary | ICD-10-CM

## 2024-09-24 DIAGNOSIS — C189 Malignant neoplasm of colon, unspecified: Secondary | ICD-10-CM | POA: Diagnosis not present

## 2024-09-24 DIAGNOSIS — K5010 Crohn's disease of large intestine without complications: Secondary | ICD-10-CM | POA: Diagnosis not present

## 2024-09-24 DIAGNOSIS — C187 Malignant neoplasm of sigmoid colon: Secondary | ICD-10-CM | POA: Diagnosis present

## 2024-09-24 DIAGNOSIS — Z932 Ileostomy status: Secondary | ICD-10-CM | POA: Insufficient documentation

## 2024-09-24 LAB — CBC WITH DIFFERENTIAL/PLATELET
Abs Immature Granulocytes: 0.01 K/uL (ref 0.00–0.07)
Basophils Absolute: 0.1 K/uL (ref 0.0–0.1)
Basophils Relative: 1 %
Eosinophils Absolute: 0.1 K/uL (ref 0.0–0.5)
Eosinophils Relative: 2 %
HCT: 36.9 % — ABNORMAL LOW (ref 39.0–52.0)
Hemoglobin: 10.9 g/dL — ABNORMAL LOW (ref 13.0–17.0)
Immature Granulocytes: 0 %
Lymphocytes Relative: 26 %
Lymphs Abs: 1.5 K/uL (ref 0.7–4.0)
MCH: 24.8 pg — ABNORMAL LOW (ref 26.0–34.0)
MCHC: 29.5 g/dL — ABNORMAL LOW (ref 30.0–36.0)
MCV: 83.9 fL (ref 80.0–100.0)
Monocytes Absolute: 0.8 K/uL (ref 0.1–1.0)
Monocytes Relative: 14 %
Neutro Abs: 3.3 K/uL (ref 1.7–7.7)
Neutrophils Relative %: 57 %
Platelets: 253 K/uL (ref 150–400)
RBC: 4.4 MIL/uL (ref 4.22–5.81)
RDW: 19.2 % — ABNORMAL HIGH (ref 11.5–15.5)
WBC: 5.9 K/uL (ref 4.0–10.5)
nRBC: 0 % (ref 0.0–0.2)

## 2024-09-24 LAB — IRON AND TIBC
Iron: 76 ug/dL (ref 45–182)
Saturation Ratios: 16 % — ABNORMAL LOW (ref 17.9–39.5)
TIBC: 480 ug/dL — ABNORMAL HIGH (ref 250–450)
UIBC: 404 ug/dL

## 2024-09-24 LAB — SAMPLE TO BLOOD BANK

## 2024-09-24 LAB — ABO/RH: ABO/RH(D): B POS

## 2024-09-24 LAB — FERRITIN: Ferritin: 28 ng/mL (ref 24–336)

## 2024-09-24 MED ORDER — SODIUM CHLORIDE 0.9% FLUSH
10.0000 mL | Freq: Once | INTRAVENOUS | Status: AC | PRN
  Administered 2024-09-24: 10 mL
  Filled 2024-09-24: qty 10

## 2024-09-24 MED ORDER — IRON SUCROSE 20 MG/ML IV SOLN
200.0000 mg | Freq: Once | INTRAVENOUS | Status: AC
  Administered 2024-09-24: 200 mg via INTRAVENOUS
  Filled 2024-09-24: qty 10

## 2024-09-24 NOTE — Progress Notes (Unsigned)
 [facility]  Telephone:([phone]) [phone] Fax:([phone]) [phone]  ID: Matthew [name] OB: Feb 15, 1966  MR#: [id number]  RDW#:[id number]  Patient Care Team: Arloa Lauraine Hun, FNP as PCP - General (Nurse Practitioner) Maurie Rayfield BIRCH, RN as Oncology Nurse Navigator Jacobo, Evalene PARAS, MD as Consulting Physician (Oncology)  CHIEF COMPLAINT: Stage IIa adenocarcinoma of the sigmoid colon.  INTERVAL HISTORY: Patient returns to clinic today for further evaluation and consideration of IV Venofer.  He had a total colectomy on August 06, 2024.  He continues to have weakness and fatigue, but this is improved.  He otherwise feels well.  He has no neurologic complaints. He denies any recent fevers or illnesses.  He has a good appetite and denies weight loss.  He has no chest pain, shortness of breath, cough, or hemoptysis.  He denies any nausea, vomiting, constipation, diarrhea.  He noted no melena or hematochezia in his ileostomy bag.  He has no urinary complaints.  Offers no further specific complaints today.  REVIEW OF SYSTEMS:   Review of Systems  Constitutional:  Positive for malaise/fatigue. Negative for fever and weight loss.  Respiratory: Negative.  Negative for cough, hemoptysis and shortness of breath.   Cardiovascular: Negative.  Negative for chest pain and leg swelling.  Gastrointestinal: Negative.  Negative for abdominal pain, blood in stool and melena.  Genitourinary: Negative.  Negative for dysuria.  Musculoskeletal: Negative.  Negative for back pain.  Skin: Negative.  Negative for rash.  Neurological:  Positive for weakness. Negative for dizziness, focal weakness and headaches.  Psychiatric/Behavioral: Negative.  The patient is not nervous/anxious.     As per HPI. Otherwise, a complete review of systems is negative.  PAST MEDICAL HISTORY: Past Medical History:  Diagnosis Date   Colon polyps    2007   Crohn's disease (HCC)    Diabetes (HCC)    Glaucoma     History of blood transfusion    2001   HTN (hypertension)    Hyperlipidemia    Inflammatory bowel disease    Primary generalized (osteo)arthritis    Ulcerative colitis, unspecified     PAST SURGICAL HISTORY: Past Surgical History:  Procedure Laterality Date   COLON RESECTION  2001   COLONOSCOPY  2018   cornea transplant both eyes Bilateral    left, 2004; right 2005   EYE SURGERY     laser OS April 2014   GLAUCOMA SURGERY Left 2014   VENTRAL HERNIA REPAIR  2002    FAMILY HISTORY: Family History  Problem Relation Age of Onset   Colon polyps Mother    Ovarian cancer Mother 56       reported neg GT 2016   Colitis Father    Colon polyps Father    Heart disease Father    Lung cancer Father 61   Lung cancer Maternal Aunt    Lung cancer Maternal Aunt    Lung cancer Maternal Aunt    Cancer Maternal Uncle        unk type   Colon cancer Maternal Grandfather 38   Heart disease Paternal Grandmother    Brain cancer Paternal Grandfather    Breast cancer Maternal Cousin    Prostate cancer Maternal Cousin    Bone cancer Maternal Cousin        spine   Colon cancer Maternal Cousin    Cervical cancer Maternal Cousin    Esophageal cancer Neg Hx    Rectal cancer Neg Hx    Stomach cancer Neg Hx  ADVANCED DIRECTIVES (Y/N):  N  HEALTH MAINTENANCE: Social History   Tobacco Use   Smoking status: Never   Smokeless tobacco: Never  Vaping Use   Vaping status: Never Used  Substance Use Topics   Alcohol use: No   Drug use: No     Colonoscopy:  PAP:  Bone density:  Lipid panel:  Allergies  Allergen Reactions   Contrast Media [Iodinated Contrast Media] Hives   Dimetapp Allergy Sinus [Brompheniramine-Ppa-Apap] Hives   Doxycycline  Nausea And Vomiting   Pseudoephedrine Hives and Other (See Comments)    redness   Prednisone  Nausea And Vomiting    Current Outpatient Medications  Medication Sig Dispense Refill   acetaminophen  (TYLENOL ) 500 MG tablet Take 500 mg by mouth  every 6 (six) hours as needed for pain.     ADMELOG SOLOSTAR 100 UNIT/ML KwikPen ADMINISTER 10 UNITS WITH EACH MEAL     amLODipine (NORVASC) 10 MG tablet Take 10 mg by mouth daily.     atorvastatin (LIPITOR) 10 MG tablet Take 10 mg by mouth daily.  2   clotrimazole (LOTRIMIN) 1 % cream APPLY TO THE AFFECTED AREAS 2 TIMES PER DAY FOR 2-4 WEEKS     diphenhydrAMINE  (BENADRYL  ALLERGY) 25 MG tablet Take both tabs by mouth 7/9 at 1pm 2 tablet 0   diphenhydrAMINE  (BENADRYL ) 25 MG tablet Take 2 tabs at 9:30am 09/05/12 2 tablet 0   fluorometholone (FML) 0.1 % ophthalmic suspension Place 1 drop into the right eye 2 (two) times daily.     glucosamine-chondroitin 500-400 MG tablet 1 tablet.     glucose blood test strip TAKE 1 STRIP EVERY DAY BY MISCELL. ROUTE AS NEEDED.     Lancets (ONETOUCH ULTRASOFT) lancets USE 1 LANCET EVERYDAY AS NEEDED     LANTUS SOLOSTAR 100 UNIT/ML Solostar Pen Inject 25 units every day by subcutaneous route in the morning for 30 days.     Loperamide HCl (IMODIUM PO) Take by mouth daily.     losartan-hydrochlorothiazide (HYZAAR) 100-25 MG tablet Take 1 tablet by mouth daily.  2   mesalamine  (PENTASA ) 500 MG CR capsule TAKE 4 CAPSULES BY MOUTH TWICE A DAY 720 capsule 3   mesalamine  (ROWASA ) 4 g enema PLACE 60 MLS (4 G TOTAL) RECTALLY AT BEDTIME. 5040 mL 4   NON FORMULARY Place 1 drop into both eyes.     OVER THE COUNTER MEDICATION daily as needed. GAS X     oxybutynin (DITROPAN) 5 MG tablet Take 5 mg by mouth.     polyvinyl alcohol (LIQUIFILM TEARS) 1.4 % ophthalmic solution 1 drop at bedtime and may repeat dose one time if needed.     predniSONE  (DELTASONE ) 50 MG tablet Pt to take 1 tab by mouth on 7/9 at 1am, then 1 tab at 7am, then 1 tab at 1pm prior to ct scan 3 tablet 0   propranolol (INDERAL) 60 MG tablet Take 60 mg by mouth 2 (two) times daily.     timolol (TIMOPTIC) 0.5 % ophthalmic solution Place 1 drop into the left eye 2 (two) times daily.     tobramycin-dexamethasone  (TOBRADEX) ophthalmic solution Place 1 drop into the left eye 3 times daily. for 1 week, taper to 2 times a day for 1 week, taper 1 time a day for 1 week then stop.     zolpidem  (AMBIEN ) 10 MG tablet Take 1 tablet (10 mg total) by mouth at bedtime. Office visit for further refills 30 tablet 0   metFORMIN (GLUCOPHAGE) 500 MG  tablet Take 1 tablet by mouth 1 day or 1 dose. (Patient not taking: Reported on 09/24/2024)  2   Multiple Vitamin (MULTI-VITAMINS) TABS 1 tablet. (Patient not taking: Reported on 09/24/2024)     OZEMPIC, 0.25 OR 0.5 MG/DOSE, 2 MG/1.5ML SOPN Inject 0.5 mg as directed once a week. (Patient not taking: Reported on 09/24/2024)     No current facility-administered medications for this visit.    OBJECTIVE: Vitals:   09/24/24 1343  BP: 133/81  Pulse: 63  Resp: 18  Temp: 97.9 F (36.6 C)  SpO2: 100%     Body mass index is 38.02 kg/m.    ECOG FS:1 - Symptomatic but completely ambulatory  General: Well-developed, well-nourished, no acute distress. Eyes: Pink conjunctiva, anicteric sclera. HEENT: Normocephalic, moist mucous membranes. Lungs: No audible wheezing or coughing. Heart: Regular rate and rhythm. Abdomen: Soft, nontender, no obvious distention.  Ileostomy bag noted. Musculoskeletal: No edema, cyanosis, or clubbing. Neuro: Alert, answering all questions appropriately. Cranial nerves grossly intact. Skin: No rashes or petechiae noted. Psych: Normal affect.   LAB RESULTS:  Lab Results  Component Value Date   NA 142 06/24/2024   K 3.8 06/24/2024   CL 108 06/24/2024   CO2 23 06/24/2024   GLUCOSE 130 (H) 06/24/2024   BUN 15 06/24/2024   CREATININE 0.88 06/24/2024   CALCIUM 9.3 06/24/2024   PROT 7.6 06/24/2024   ALBUMIN 3.8 06/24/2024   AST 28 06/24/2024   ALT 23 06/24/2024   ALKPHOS 66 06/24/2024   BILITOT 1.3 (H) 06/24/2024   GFRNONAA >60 06/24/2024   GFRAA >90 09/03/2012    Lab Results  Component Value Date   WBC 5.9 09/24/2024   NEUTROABS 3.3  09/24/2024   HGB 10.9 (L) 09/24/2024   HCT 36.9 (L) 09/24/2024   MCV 83.9 09/24/2024   PLT 253 09/24/2024   Lab Results  Component Value Date   IRON 76 09/24/2024   TIBC 480 (H) 09/24/2024   IRONPCTSAT 16 (L) 09/24/2024   Lab Results  Component Value Date   FERRITIN 28 09/24/2024     STUDIES: No results found.  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  Biopsy confirmed malignancy.  CT scan scheduled for July 02, 2024 which did not reveal metastatic disease.  He subsequently underwent total colectomy on August 06, 2024 which revealed the above-stated malignancy.  He now has an ileostomy.  0 of 12 lymph nodes were negative for carcinoma and patient had no high risk features therefore adjuvant chemotherapy is not necessary.  Patient reports he has a CT scan scheduled in November 2025.  No further intervention is needed.  Will continue to monitor CEA and CT scans approximately every 6 months. Iron-deficiency anemia: Patient's hemoglobin iron stores are improved, but still decreased.  Proceed with 200 mg IV Venofer today.  Patient will then return to clinic 3 additional times over the next 1 to 2 weeks for further treatment.  He will then return to clinic in 3 months with repeat laboratory, further evaluation, and continuation of treatment if needed.  I spent a total of 30 minutes reviewing chart data, face-to-face evaluation with the patient, counseling and coordination of care as detailed above.   Patient expressed understanding and was in agreement with this plan. He also understands that  He can call clinic at any time with any questions, concerns, or complaints.    Cancer Staging  Adenocarcinoma of colon [facility]) Staging form: Colon and Rectum, AJCC  8th Edition - Pathologic stage from 09/25/2024: Stage IIA (pT3, pN0, cM0) - Signed by Jacobo Evalene PARAS, MD on 09/25/2024 Stage prefix: Initial diagnosis Total positive nodes: 0   Evalene PARAS Jacobo, MD   09/25/2024 8:41 AM

## 2024-09-24 NOTE — Progress Notes (Unsigned)
 Patient states that has been having some lower back pain, he was recently tested for a UTI, which came back negative.

## 2024-09-24 NOTE — Progress Notes (Signed)
 Patient tolerated Venofer infusion well, no questions/concerns voiced. Monitored 30 min post transfusion. Patient stable at discharge. VSS. AVS given.

## 2024-09-24 NOTE — Patient Instructions (Signed)

## 2024-09-25 ENCOUNTER — Encounter: Payer: Self-pay | Admitting: Oncology

## 2024-09-25 DIAGNOSIS — C189 Malignant neoplasm of colon, unspecified: Secondary | ICD-10-CM | POA: Insufficient documentation

## 2024-09-29 ENCOUNTER — Inpatient Hospital Stay

## 2024-09-29 ENCOUNTER — Ambulatory Visit: Admitting: Oncology

## 2024-09-29 VITALS — BP 145/70 | HR 61 | Temp 97.0°F | Resp 18

## 2024-09-29 DIAGNOSIS — D509 Iron deficiency anemia, unspecified: Secondary | ICD-10-CM | POA: Diagnosis not present

## 2024-09-29 MED ORDER — SODIUM CHLORIDE 0.9% FLUSH
10.0000 mL | Freq: Once | INTRAVENOUS | Status: AC | PRN
  Administered 2024-09-29: 10 mL
  Filled 2024-09-29: qty 10

## 2024-09-29 MED ORDER — IRON SUCROSE 20 MG/ML IV SOLN
200.0000 mg | Freq: Once | INTRAVENOUS | Status: AC
  Administered 2024-09-29: 200 mg via INTRAVENOUS
  Filled 2024-09-29: qty 10

## 2024-09-30 ENCOUNTER — Ambulatory Visit: Admitting: Oncology

## 2024-10-01 ENCOUNTER — Inpatient Hospital Stay

## 2024-10-01 VITALS — BP 134/67 | HR 61 | Temp 98.6°F | Resp 19

## 2024-10-01 DIAGNOSIS — D509 Iron deficiency anemia, unspecified: Secondary | ICD-10-CM

## 2024-10-01 MED ORDER — IRON SUCROSE 20 MG/ML IV SOLN
200.0000 mg | Freq: Once | INTRAVENOUS | Status: AC
  Administered 2024-10-01: 200 mg via INTRAVENOUS
  Filled 2024-10-01: qty 10

## 2024-10-01 MED ORDER — SODIUM CHLORIDE 0.9% FLUSH
10.0000 mL | Freq: Once | INTRAVENOUS | Status: AC | PRN
  Administered 2024-10-01: 10 mL
  Filled 2024-10-01: qty 10

## 2024-10-03 ENCOUNTER — Inpatient Hospital Stay

## 2024-10-03 VITALS — BP 146/67 | HR 61 | Temp 96.8°F | Resp 18

## 2024-10-03 DIAGNOSIS — D509 Iron deficiency anemia, unspecified: Secondary | ICD-10-CM | POA: Diagnosis not present

## 2024-10-03 MED ORDER — SODIUM CHLORIDE 0.9% FLUSH
10.0000 mL | Freq: Once | INTRAVENOUS | Status: AC | PRN
  Administered 2024-10-03: 10 mL
  Filled 2024-10-03: qty 10

## 2024-10-03 MED ORDER — IRON SUCROSE 20 MG/ML IV SOLN
200.0000 mg | Freq: Once | INTRAVENOUS | Status: AC
  Administered 2024-10-03: 200 mg via INTRAVENOUS
  Filled 2024-10-03: qty 10

## 2024-10-24 ENCOUNTER — Encounter: Payer: Self-pay | Admitting: Oncology

## 2024-11-06 ENCOUNTER — Other Ambulatory Visit: Payer: Self-pay | Admitting: Internal Medicine

## 2024-12-26 ENCOUNTER — Other Ambulatory Visit: Payer: Self-pay

## 2024-12-26 DIAGNOSIS — D509 Iron deficiency anemia, unspecified: Secondary | ICD-10-CM

## 2024-12-29 ENCOUNTER — Inpatient Hospital Stay: Attending: Oncology

## 2024-12-29 DIAGNOSIS — K5010 Crohn's disease of large intestine without complications: Secondary | ICD-10-CM | POA: Diagnosis not present

## 2024-12-29 DIAGNOSIS — C187 Malignant neoplasm of sigmoid colon: Secondary | ICD-10-CM | POA: Diagnosis present

## 2024-12-29 DIAGNOSIS — D509 Iron deficiency anemia, unspecified: Secondary | ICD-10-CM | POA: Diagnosis present

## 2024-12-29 LAB — FERRITIN: Ferritin: 169 ng/mL (ref 24–336)

## 2024-12-29 LAB — CBC WITH DIFFERENTIAL/PLATELET
Abs Immature Granulocytes: 0.03 K/uL (ref 0.00–0.07)
Basophils Absolute: 0 K/uL (ref 0.0–0.1)
Basophils Relative: 1 %
Eosinophils Absolute: 0.2 K/uL (ref 0.0–0.5)
Eosinophils Relative: 3 %
HCT: 41.8 % (ref 39.0–52.0)
Hemoglobin: 14.2 g/dL (ref 13.0–17.0)
Immature Granulocytes: 0 %
Lymphocytes Relative: 27 %
Lymphs Abs: 1.9 K/uL (ref 0.7–4.0)
MCH: 28 pg (ref 26.0–34.0)
MCHC: 34 g/dL (ref 30.0–36.0)
MCV: 82.3 fL (ref 80.0–100.0)
Monocytes Absolute: 0.8 K/uL (ref 0.1–1.0)
Monocytes Relative: 12 %
Neutro Abs: 4.1 K/uL (ref 1.7–7.7)
Neutrophils Relative %: 57 %
Platelets: 206 K/uL (ref 150–400)
RBC: 5.08 MIL/uL (ref 4.22–5.81)
RDW: 13.5 % (ref 11.5–15.5)
WBC: 7.1 K/uL (ref 4.0–10.5)
nRBC: 0 % (ref 0.0–0.2)

## 2024-12-29 LAB — IRON AND TIBC
Iron: 125 ug/dL (ref 45–182)
Saturation Ratios: 33 % (ref 17.9–39.5)
TIBC: 374 ug/dL (ref 250–450)
UIBC: 249 ug/dL

## 2024-12-30 ENCOUNTER — Encounter: Payer: Self-pay | Admitting: Oncology

## 2024-12-30 ENCOUNTER — Inpatient Hospital Stay

## 2024-12-30 ENCOUNTER — Inpatient Hospital Stay: Admitting: Oncology

## 2024-12-30 VITALS — BP 162/73 | HR 64 | Temp 98.6°F | Resp 17 | Wt 283.6 lb

## 2024-12-30 DIAGNOSIS — D509 Iron deficiency anemia, unspecified: Secondary | ICD-10-CM

## 2024-12-30 NOTE — Progress Notes (Signed)
 " [facility]  [phone] [phone]  ID: Matthew [name] OB: 1966-07-15  MR#: [id number]  RDW#:[id number]  Patient Care Team: Arloa Lauraine Hun, FNP as PCP - General (Nurse Practitioner) Maurie Rayfield BIRCH, RN as Oncology Nurse Navigator Jacobo, Evalene PARAS, MD as Consulting Physician (Oncology)  CHIEF COMPLAINT: Stage IIa adenocarcinoma of the sigmoid colon.  INTERVAL HISTORY: Patient returns to clinic today for repeat laboratory work, further evaluation, and consideration of additional IV Venofer .  He currently feels well and is asymptomatic.  He does not complain of any weakness or fatigue. He has no neurologic complaints. He denies any recent fevers or illnesses.  He has a good appetite and denies weight loss.  He has no chest pain, shortness of breath, cough, or hemoptysis.  He denies any nausea, vomiting, constipation, diarrhea.  He noted no melena or hematochezia in his ileostomy bag.  He has no urinary complaints.  Patient offers no specific complaints today.  REVIEW OF SYSTEMS:   Review of Systems  Constitutional: Negative.  Negative for fever, malaise/fatigue and weight loss.  Respiratory: Negative.  Negative for cough, hemoptysis and shortness of breath.   Cardiovascular: Negative.  Negative for chest pain and leg swelling.  Gastrointestinal: Negative.  Negative for abdominal pain, blood in stool and melena.  Genitourinary: Negative.  Negative for dysuria.  Musculoskeletal: Negative.  Negative for back pain.  Skin: Negative.  Negative for rash.  Neurological: Negative.  Negative for dizziness, focal weakness, weakness and headaches.  Psychiatric/Behavioral: Negative.  The patient is not nervous/anxious.     As per HPI. Otherwise, a complete review of systems is negative.  PAST MEDICAL HISTORY: Past Medical History:  Diagnosis Date   Colon polyps    2007   Crohn's disease (HCC)    Diabetes (HCC)    Glaucoma    History of blood  transfusion    2001   HTN (hypertension)    Hyperlipidemia    Inflammatory bowel disease    Primary generalized (osteo)arthritis    Ulcerative colitis, unspecified     PAST SURGICAL HISTORY: Past Surgical History:  Procedure Laterality Date   COLON RESECTION  2001   COLONOSCOPY  2018   cornea transplant both eyes Bilateral    left, 2004; right 2005   EYE SURGERY     laser OS April 2014   GLAUCOMA SURGERY Left 2014   VENTRAL HERNIA REPAIR  2002    FAMILY HISTORY: Family History  Problem Relation Age of Onset   Colon polyps Mother    Ovarian cancer Mother 38       reported neg GT 2016   Colitis Father    Colon polyps Father    Heart disease Father    Lung cancer Father 35   Lung cancer Maternal Aunt    Lung cancer Maternal Aunt    Lung cancer Maternal Aunt    Cancer Maternal Uncle        unk type   Colon cancer Maternal Grandfather 3   Heart disease Paternal Grandmother    Brain cancer Paternal Grandfather    Breast cancer Maternal Cousin    Prostate cancer Maternal Cousin    Bone cancer Maternal Cousin        spine   Colon cancer Maternal Cousin    Cervical cancer Maternal Cousin    Esophageal cancer Neg Hx    Rectal cancer Neg Hx    Stomach cancer Neg Hx     ADVANCED DIRECTIVES (Y/N):  N  HEALTH MAINTENANCE: Social History   Tobacco Use   Smoking status: Never   Smokeless tobacco: Never  Vaping Use   Vaping status: Never Used  Substance Use Topics   Alcohol use: No   Drug use: No     Colonoscopy:  PAP:  Bone density:  Lipid panel:  Allergies  Allergen Reactions   Contrast Media [Iodinated Contrast Media] Hives   Dimetapp Allergy Sinus [Brompheniramine-Ppa-Apap] Hives   Doxycycline  Nausea And Vomiting   Pseudoephedrine Hives and Other (See Comments)    redness   Bee Pollen Itching and Swelling   Prednisone  Nausea And Vomiting    Current Outpatient Medications  Medication Sig Dispense Refill   acetaminophen  (TYLENOL ) 500 MG tablet  Take 500 mg by mouth every 6 (six) hours as needed for pain.     ADMELOG SOLOSTAR 100 UNIT/ML KwikPen ADMINISTER 10 UNITS WITH EACH MEAL     amLODipine (NORVASC) 10 MG tablet Take 10 mg by mouth daily.     atorvastatin (LIPITOR) 10 MG tablet Take 10 mg by mouth daily.  2   Blood Glucose Monitoring Suppl (ACCU-CHEK GUIDE) w/Device KIT      clotrimazole (LOTRIMIN) 1 % cream APPLY TO THE AFFECTED AREAS 2 TIMES PER DAY FOR 2-4 WEEKS     diphenhydrAMINE  (BENADRYL  ALLERGY) 25 MG tablet Take both tabs by mouth 7/9 at 1pm 2 tablet 0   diphenhydrAMINE  (BENADRYL ) 25 MG tablet Take 2 tabs at 9:30am 09/05/12 2 tablet 0   EMBECTA PEN NEEDLE ULTRAFINE 31G X 8 MM MISC 4 (four) times daily.     fluorometholone (FML) 0.1 % ophthalmic suspension Place 1 drop into the right eye 2 (two) times daily.     glucosamine-chondroitin 500-400 MG tablet 1 tablet.     glucose blood test strip TAKE 1 STRIP EVERY DAY BY MISCELL. ROUTE AS NEEDED.     Lancets (ONETOUCH ULTRASOFT) lancets USE 1 LANCET EVERYDAY AS NEEDED     LANTUS SOLOSTAR 100 UNIT/ML Solostar Pen Inject 25 units every day by subcutaneous route in the morning for 30 days.     Loperamide HCl (IMODIUM PO) Take by mouth daily.     losartan-hydrochlorothiazide (HYZAAR) 100-25 MG tablet Take 1 tablet by mouth daily.  2   mesalamine  (PENTASA ) 500 MG CR capsule TAKE 4 CAPSULES BY MOUTH TWICE A DAY 720 capsule 3   mesalamine  (ROWASA ) 4 g enema PLACE 60 MLS (4 G TOTAL) RECTALLY AT BEDTIME. 5040 mL 4   NON FORMULARY Place 1 drop into both eyes.     OVER THE COUNTER MEDICATION daily as needed. GAS X     oxybutynin (DITROPAN) 5 MG tablet Take 5 mg by mouth.     polyvinyl alcohol (LIQUIFILM TEARS) 1.4 % ophthalmic solution 1 drop at bedtime and may repeat dose one time if needed.     predniSONE  (DELTASONE ) 50 MG tablet Pt to take 1 tab by mouth on 7/9 at 1am, then 1 tab at 7am, then 1 tab at 1pm prior to ct scan 3 tablet 0   propranolol (INDERAL) 60 MG tablet Take 60 mg by  mouth 2 (two) times daily.     timolol (TIMOPTIC) 0.5 % ophthalmic solution Place 1 drop into the left eye 2 (two) times daily.     tobramycin-dexamethasone (TOBRADEX) ophthalmic solution Place 1 drop into the left eye 3 times daily. for 1 week, taper to 2 times a day for 1 week, taper 1 time a day for 1 week then stop.  zolpidem  (AMBIEN ) 10 MG tablet Take 1 tablet (10 mg total) by mouth at bedtime. Office visit for further refills 30 tablet 0   metFORMIN (GLUCOPHAGE) 500 MG tablet Take 1 tablet by mouth 1 day or 1 dose. (Patient not taking: Reported on 12/30/2024)  2   Multiple Vitamin (MULTI-VITAMINS) TABS 1 tablet. (Patient not taking: Reported on 12/30/2024)     OZEMPIC, 0.25 OR 0.5 MG/DOSE, 2 MG/1.5ML SOPN Inject 0.5 mg as directed once a week. (Patient not taking: Reported on 12/30/2024)     No current facility-administered medications for this visit.    OBJECTIVE: Vitals:   12/30/24 1443  BP: (!) 162/73  Pulse: 64  Resp: 17  Temp: 98.6 F (37 C)  SpO2: 95%     Body mass index is 40.69 kg/m.    ECOG FS:0 - Asymptomatic  General: Well-developed, well-nourished, no acute distress. Eyes: Pink conjunctiva, anicteric sclera. HEENT: Normocephalic, moist mucous membranes. Lungs: No audible wheezing or coughing. Heart: Regular rate and rhythm. Abdomen: Soft, nontender, no obvious distention. Musculoskeletal: No edema, cyanosis, or clubbing. Neuro: Alert, answering all questions appropriately. Cranial nerves grossly intact. Skin: No rashes or petechiae noted. Psych: Normal affect.  LAB RESULTS:  Lab Results  Component Value Date   NA 142 06/24/2024   K 3.8 06/24/2024   CL 108 06/24/2024   CO2 23 06/24/2024   GLUCOSE 130 (H) 06/24/2024   BUN 15 06/24/2024   CREATININE 0.88 06/24/2024   CALCIUM 9.3 06/24/2024   PROT 7.6 06/24/2024   ALBUMIN 3.8 06/24/2024   AST 28 06/24/2024   ALT 23 06/24/2024   ALKPHOS 66 06/24/2024   BILITOT 1.3 (H) 06/24/2024   GFRNONAA >60 06/24/2024    GFRAA >90 09/03/2012    Lab Results  Component Value Date   WBC 7.1 12/29/2024   NEUTROABS 4.1 12/29/2024   HGB 14.2 12/29/2024   HCT 41.8 12/29/2024   MCV 82.3 12/29/2024   PLT 206 12/29/2024   Lab Results  Component Value Date   IRON  125 12/29/2024   TIBC 374 12/29/2024   IRONPCTSAT 33 12/29/2024   Lab Results  Component Value Date   FERRITIN 169 12/29/2024     STUDIES: No results found.  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  Biopsy confirmed malignancy.  CT scan on July 02, 2024 did not reveal metastatic disease.  He subsequently underwent total colectomy on August 06, 2024 which revealed the above-stated malignancy.  He now has an ileostomy.  0 of 12 lymph nodes were negative for carcinoma and patient had no high risk features therefore adjuvant chemotherapy is not necessary.  CT scan of the abdomen and pelvis without contrast on November 03, 2024 did not reveal any evidence of recurrent or progressive disease.  Patient has an appointment with a surgeon in April 2026.  Will follow-up in clinic several days later.  Can consider monitoring CEA and CT scans every 6 months.  Iron -deficiency anemia: Resolved.  Patient's hemoglobin iron  stores are now within normal limits.  He last received 200 mg IV Venofer  on October 03, 2024.  No intervention is needed at this time.  Return to clinic at the end of April as above for repeat laboratory, further evaluation, continuation of treatment if needed.    I spent a  total of 20 minutes reviewing chart data, face-to-face evaluation with the patient, counseling and coordination of care as detailed above.   Patient expressed understanding and was in agreement with this plan. He also understands that He  can call clinic at any time with any questions, concerns, or complaints.    Cancer Staging  Adenocarcinoma of colon [facility]) Staging form: Colon and Rectum, AJCC 8th Edition - Pathologic stage from 09/25/2024: Stage IIA (pT3, pN0, cM0) - Signed by Jacobo Evalene PARAS, MD on 09/25/2024 Stage prefix: Initial diagnosis Total positive nodes: 0   Evalene PARAS Jacobo, MD   12/30/2024 3:05 PM     "

## 2025-04-21 ENCOUNTER — Inpatient Hospital Stay

## 2025-04-22 ENCOUNTER — Inpatient Hospital Stay: Admitting: Oncology

## 2025-04-22 ENCOUNTER — Inpatient Hospital Stay
